# Patient Record
Sex: Male | Born: 1980 | Race: White | Hispanic: No | Marital: Married
Health system: Midwestern US, Community
[De-identification: ages and names within clinical notes are randomized; demographics above are authoritative.]

## PROBLEM LIST (undated history)

## (undated) DIAGNOSIS — G43909 Migraine, unspecified, not intractable, without status migrainosus: Secondary | ICD-10-CM

## (undated) DIAGNOSIS — K589 Irritable bowel syndrome without diarrhea: Secondary | ICD-10-CM

## (undated) DIAGNOSIS — E785 Hyperlipidemia, unspecified: Secondary | ICD-10-CM

## (undated) DIAGNOSIS — F419 Anxiety disorder, unspecified: Secondary | ICD-10-CM

## (undated) DIAGNOSIS — S02609A Fracture of mandible, unspecified, initial encounter for closed fracture: Secondary | ICD-10-CM

## (undated) DIAGNOSIS — E039 Hypothyroidism, unspecified: Secondary | ICD-10-CM

## (undated) DIAGNOSIS — R569 Unspecified convulsions: Secondary | ICD-10-CM

## (undated) HISTORY — DX: Anxiety disorder, unspecified: F41.9

## (undated) HISTORY — DX: Hypothyroidism, unspecified: E03.9

## (undated) HISTORY — DX: Irritable bowel syndrome, unspecified: K58.9

## (undated) HISTORY — PX: HIP SURGERY: SHX245

## (undated) HISTORY — PX: ESOPHAGOGASTRODUODENOSCOPY: SHX1529

## (undated) HISTORY — PX: COLONOSCOPY W/ BIOPSIES: SHX1374

## (undated) HISTORY — PX: MANDIBLE SURGERY: SHX707

---
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
## 2007-12-04 ENCOUNTER — Emergency Department (HOSPITAL_COMMUNITY): Admission: EM | Admit: 2007-12-04 | Discharge: 2007-12-04 | Payer: Self-pay | Admitting: Emergency Medicine

## 2008-12-19 ENCOUNTER — Emergency Department (HOSPITAL_COMMUNITY): Admission: EM | Admit: 2008-12-19 | Discharge: 2008-12-19 | Payer: Self-pay | Admitting: Emergency Medicine

## 2009-03-23 ENCOUNTER — Encounter: Admission: RE | Admit: 2009-03-23 | Discharge: 2009-03-23 | Payer: Self-pay | Admitting: Family Medicine

## 2009-10-26 ENCOUNTER — Encounter: Payer: Self-pay | Admitting: Gastroenterology

## 2009-10-26 ENCOUNTER — Encounter: Admission: RE | Admit: 2009-10-26 | Discharge: 2009-10-26 | Payer: Self-pay | Admitting: Family Medicine

## 2009-10-27 ENCOUNTER — Encounter: Payer: Self-pay | Admitting: Gastroenterology

## 2009-10-28 ENCOUNTER — Encounter: Payer: Self-pay | Admitting: Gastroenterology

## 2009-10-29 DIAGNOSIS — R3129 Other microscopic hematuria: Secondary | ICD-10-CM | POA: Insufficient documentation

## 2009-10-29 DIAGNOSIS — E7800 Pure hypercholesterolemia, unspecified: Secondary | ICD-10-CM | POA: Insufficient documentation

## 2009-10-29 DIAGNOSIS — F411 Generalized anxiety disorder: Secondary | ICD-10-CM | POA: Insufficient documentation

## 2009-10-29 DIAGNOSIS — E039 Hypothyroidism, unspecified: Secondary | ICD-10-CM | POA: Insufficient documentation

## 2009-10-30 ENCOUNTER — Ambulatory Visit: Payer: Self-pay | Admitting: Gastroenterology

## 2009-10-30 DIAGNOSIS — R197 Diarrhea, unspecified: Secondary | ICD-10-CM | POA: Insufficient documentation

## 2009-11-03 ENCOUNTER — Telehealth: Payer: Self-pay | Admitting: Gastroenterology

## 2009-11-04 ENCOUNTER — Encounter (INDEPENDENT_AMBULATORY_CARE_PROVIDER_SITE_OTHER): Payer: Self-pay | Admitting: *Deleted

## 2009-11-06 ENCOUNTER — Ambulatory Visit: Payer: Self-pay | Admitting: Gastroenterology

## 2009-11-06 DIAGNOSIS — K2900 Acute gastritis without bleeding: Secondary | ICD-10-CM | POA: Insufficient documentation

## 2009-11-06 LAB — CONVERTED CEMR LAB: UREASE: NEGATIVE

## 2009-11-10 ENCOUNTER — Encounter: Payer: Self-pay | Admitting: Gastroenterology

## 2009-12-28 ENCOUNTER — Ambulatory Visit: Payer: Self-pay | Admitting: Endocrinology

## 2010-03-14 ENCOUNTER — Emergency Department (HOSPITAL_COMMUNITY): Admission: EM | Admit: 2010-03-14 | Discharge: 2010-03-15 | Payer: Self-pay | Admitting: Emergency Medicine

## 2010-05-23 ENCOUNTER — Encounter: Payer: Self-pay | Admitting: Orthopaedic Surgery

## 2010-06-01 NOTE — Progress Notes (Signed)
Summary: update condt  Phone Note Call from Patient Call back at Home Phone [phone]   Caller: Patient Call For: Jarold Motto Reason for Call: Talk to Nurse Summary of Call: Patient wants to let Dr Jarold Motto know that his body feels better but that his stomach is having a really bad side effect from the antibiotics. Initial call taken by: Tawni Levy,  November 03, 2009 2:23 PM  Follow-up for Phone Call        LM for to to call.   Lupita Leash Surface RN  November 03, 2009 2:57 PM  Pt states medication has not helped.  Diarrhea is worse.  Pain on rt side has improved but now entire abd hurts.  Pt is getting married in 2 weeks.   Follow-up by: Ashok Cordia RN,  November 04, 2009 8:57 AM  Additional Follow-up for Phone Call Additional follow up Details #1::        endo/colon ...add on if needed Additional Follow-up by: Mardella Layman MD [facility],  November 04, 2009 9:33 AM    Additional Follow-up for Phone Call Additional follow up Details #2::    Proc sch for Friday 11/06/09 at 1:00.  Talked with Quincy Carnes RN. Pt notified.  Will stop by today for insturctions. Follow-up by: Ashok Cordia RN,  November 04, 2009 10:54 AM  New/Updated Medications: MOVIPREP 100 GM  SOLR (PEG-KCL-NACL-NASULF-NA ASC-C) As per prep instructions. Prescriptions: MOVIPREP 100 GM  SOLR (PEG-KCL-NACL-NASULF-NA ASC-C) As per prep instructions.  #1 x 0   Entered by:   Ashok Cordia RN   Authorized by:   Mardella Layman MD [facility]   Signed by:   Ashok Cordia RN on 11/04/2009   Method used:   [facility]  [address] [phone]       [address]       [place], [place]  [address]       Ph: [phone]       Fax: [phone]   RxID:   [id number]

## 2010-06-01 NOTE — Miscellaneous (Signed)
Summary: Orders Update/clotest  Clinical Lists Changes  Problems: Added new problem of ACUTE GASTRITIS WITHOUT MENTION OF HEMORRHAGE (ICD-535.00) Orders: Added new Test order of TLB-H Pylori Screen Gastric Biopsy (83013-CLOTEST) - Signed 

## 2010-06-01 NOTE — Letter (Signed)
Summary: Patient Notice-Endo Biopsy Results  [facility]  [address] [place], [place] [address]   Phone: [phone]  Fax: [phone]        November 10, 2009 MRN: [id number]    [facility] [address] [place], [place]  [address]    Dear Mr. CASELLI,  I am pleased to inform you that the biopsies taken during your recent endoscopic examination did not show any evidence of cancer upon pathologic examination.Duodenal-small bowel biopsy was normal without evidence of celiac disease.  Additional information/recommendations:  __No further action is needed at this time.  Please follow-up with      your primary care physician for your other healthcare needs.  __ Please call [phone] to schedule a return visit to review      your condition.  _x_ Continue with the treatment plan as outlined on the day of your      exam.  __ You should have a repeat endoscopic examination for this problem              in _ months/years.   Please call us if you are having persistent problems or have questions about your condition that have not been fully answered at this time.  Sincerely,  Mardella Layman MD [facility]  This letter has been electronically signed by your physician.  Appended Document: Patient Notice-Endo Biopsy Results letter mailed .

## 2010-06-01 NOTE — Letter (Signed)
Summary: [facility]  [facility]   Imported By: Lester [place] 11/05/2009 11:23:35  _____________________________________________________________________  External Attachment:    Type:   Image     Comment:   External Document

## 2010-06-01 NOTE — Assessment & Plan Note (Signed)
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 He used Imodium with good success. He denies infectious disease exposure or foreign travel. He was initially seen at urgent care and had negative CBC, metabolic profile, abdominal ultrasound, stool culture, O&P, lactoferrin level, and was empirically placed on Cipro with no improvement. 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 He had no nausea vomiting, dyspepsia, R. complaints, melena, or rash. He does have chronic arthralgias for many years and has had traumatic injury previously to his right hip. He's cut back the fiber in his diet over the last month and has lost 10 pounds in weight. He denies use or abuse of NSAIDs.Stool exam previously was negative for C. difficile.   GI Review of Systems    Reports abdominal pain, bloating, and  loss of appetite.     Location of  Abdominal pain: RLQ.    Denies acid reflux, belching, chest pain, dysphagia with liquids, dysphagia with solids, heartburn, nausea, vomiting, vomiting blood, weight loss, and  weight gain.      Reports diarrhea.     Denies anal fissure, black tarry stools, change in bowel habit, constipation, diverticulosis, fecal incontinence, heme positive stool, hemorrhoids, irritable bowel syndrome, jaundice, light color stool, liver problems, rectal bleeding, and  rectal pain.    Current Medications (verified): 1)  Ambien Cr 12.5 Mg  Cr-Tabs (Zolpidem Tartrate) 2)  Levothyroxine Sodium 50 Mcg Tabs (Levothyroxine Sodium) .... Take 1 Tablet By Mouth Once A Day  Allergies (verified): No Known Drug Allergies  Past History:  Past medical, surgical, family and social histories (including risk factors) reviewed for relevance to current acute and chronic problems.  Past Medical History: Reviewed history from 10/29/2009 and no changes required. Anxiety Disorder Hyperlipidemia Hypothyroidism Chronic Benign Microhematuria  Past Surgical History: Reviewed history from 10/29/2009 and no changes required. Eye Surgery Dental Surgery  Family History: Reviewed history from 10/29/2009 and no changes required. Family History of Diabetes: Mother Family History of Heart Disease: Mother  No FH of Colon Cancer:  Social History: Reviewed history from 10/29/2009 and no changes required. 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  Alcohol Use - no Illicit Drug Use - no Smoking Status:  never Drug Use:  no  Review of Systems       The patient complains of anxiety-new, blood in urine, fatigue, headaches-new, sleeping problems, and thirst - excessive.  The patient denies allergy/sinus, anemia, arthritis/joint pain, back pain, breast changes/lumps, change in vision, confusion, cough, coughing up blood, depression-new, fainting, fever, hearing problems, heart murmur, heart rhythm changes, itching, menstrual pain, muscle pains/cramps, night sweats, nosebleeds, pregnancy symptoms, shortness of breath, skin rash, sore throat, swelling of feet/legs, swollen lymph glands, thirst - excessive , urination - excessive , urination changes/pain, urine leakage, vision changes, and voice change.         ultrasound exam showed a small 1 cm right renal cyst.  Vital Signs:  Patient profile:   30 year  old male Height:      68 inches Weight:      172 pounds BMI:     26.25 BSA:     1.92 Pulse rate:   96 /  minute Pulse rhythm:   regular BP sitting:   122 / 74  (left arm) Cuff size:   regular  Vitals Entered By: Ok Anis CMA (October 30, 2009 9:15 AM)  Physical Exam  General:  Well developed, well nourished, no acute distress.healthy appearing.   Head:  Normocephalic and atraumatic. Eyes:  PERRLA, no icterus.exam deferred to patient's ophthalmologist.   Neck:  Supple; no masses or thyromegaly. Lungs:  Clear throughout to auscultation. Heart:  Regular rate and rhythm; no murmurs, rubs,  or bruits. Abdomen:  Soft, nontender and nondistended. No masses, hepatosplenomegaly or hernias noted. Normal bowel sounds. Rectal:  Normal exam.hemocult negative.  Stool is soft, not watery, and guaiac-negative. Prostate:  .normal size prostate.   Msk:  Symmetrical with no gross deformities. Normal posture. Pulses:  Normal pulses noted. Extremities:  No clubbing, cyanosis, edema or deformities noted. Neurologic:  Alert and  oriented x4;  grossly normal neurologically. Cervical Nodes:  No significant cervical adenopathy. Psych:  Alert and cooperative. Normal mood and affect.   Impression & Recommendations:  Problem # 1:  DIARRHEA (ICD-787.91) Assessment Unchanged Symptoms and presentation suggestive of giardiasis. No reason to suspect inflammatory bowel disease or chronic malabsorption. I have placed him on metronidazole 250 mg t.i.d. for 10 days along with probiotic therapy. He is to call per his symptomatic response. If he does not improve we will proceed with colonoscopy, endoscopy, small bowel biopsy. He is to be on low fiber diet as tolerated. Strict avoidance of NSAIDs advised. The patient has no history of lactose intolerance, and denies use of sorbitol/ fructose.  Problem # 2:  ANXIETY (ICD-300.00) Assessment: Unchanged Pending marriage in 2 weeks' time. He may need further workup before he leaves the country on his honeymoon depending on his clinical course.  Patient Instructions: 1)  Pick up  your prescription from your pharmacy.  2)  Start Align one tablet by mouth once daily. 3)  Call us back in 2 weeks if symptoms do not improve. [phone] 4)  Copy sent to : Knox Royalty, MD --[facility] 5)                          Clent Ridges, MD 6)  The medication list was reviewed and reconciled.  All changed / newly prescribed medications were explained.  A complete medication list was provided to the patient / caregiver. Prescriptions: METRONIDAZOLE 250 MG TABS (METRONIDAZOLE) one tablet by mouth three times a day x 10 days  #30 x 0   Entered by:   Christie Nottingham CMA (AAMA)   Authorized by:   Mardella Layman MD [facility]   Signed by:   Mardella Layman MD [facility] on 10/30/2009   Method used:   [facility]  [address] [phone][facility] ([facility]ail)       [address]       [place], [place]  [address]       Ph: [phone]       Fax: [phone]   RxID:   [id number]

## 2010-06-01 NOTE — Letter (Signed)
Summary: [facility] Instructions  [facility]  [address] [place], [place] [address]   Phone: [phone]  Fax: [phone]       Gregory Bush    Oct 11, 1980    MRN: [id number]        Procedure Day /Date: Friday 11/06/09     Arrival Time: 12:00      Procedure Time: 1:00     Location of Procedure:                    _X _  [facility] (4th Floor)                        PREPARATION FOR COLONOSCOPY WITH MOVIPREP   Starting today do not eat nuts, seeds, popcorn, corn, beans, peas,  salads, or any raw vegetables.  Do not take any fiber supplements (e.g. Metamucil, Citrucel, and Benefiber).  THE DAY BEFORE YOUR PROCEDURE         DATE: 11/05/09    DAY: Thursday  1.  Drink clear liquids the entire day-NO SOLID FOOD  2.  Do not drink anything colored red or purple.  Avoid juices with pulp.  No orange juice.  3.  Drink at least 64 oz. (8 glasses) of fluid/clear liquids during the day to prevent dehydration and help the prep work efficiently.  CLEAR LIQUIDS INCLUDE: Water Jello Ice Popsicles Tea (sugar ok, no milk/cream) Powdered fruit flavored drinks Coffee (sugar ok, no milk/cream) Gatorade Juice: apple, white grape, white cranberry  Lemonade Clear bullion, consomm, broth Carbonated beverages (any kind) Strained chicken noodle soup Hard Candy                             4.  In the morning, mix first dose of MoviPrep solution:    Empty 1 Pouch A and 1 Pouch B into the disposable container    Add lukewarm drinking water to the top line of the container. Mix to dissolve    Refrigerate (mixed solution should be used within 24 hrs)  5.  Begin drinking the prep at 5:00 p.m. The MoviPrep container is divided by 4 marks.   Every 15 minutes drink the solution down to the next mark (approximately 8 oz) until the full liter is complete.   6.  Follow completed prep with 16 oz of clear liquid of your choice (Nothing red or purple).  Continue to drink  clear liquids until bedtime.  7.  Before going to bed, mix second dose of MoviPrep solution:    Empty 1 Pouch A and 1 Pouch B into the disposable container    Add lukewarm drinking water to the top line of the container. Mix to dissolve    Refrigerate  THE DAY OF YOUR PROCEDURE      DATE: 11/06/09  DAY: Friday  Beginning at 8:00 a.m. (5 hours before procedure):         1. Every 15 minutes, drink the solution down to the next mark (approx 8 oz) until the full liter is complete.  2. Follow completed prep with 16 oz. of clear liquid of your choice.    3. You may drink clear liquids until 11:00  (2 HOURS BEFORE PROCEDURE).   MEDICATION INSTRUCTIONS  Unless otherwise instructed, you should take regular prescription medications with a small sip of water   as early as possible the morning of your procedure.  OTHER INSTRUCTIONS  You will need a responsible adult at least 32 years of age to accompany you and drive you home.   This person must remain in the waiting room during your procedure.  Wear loose fitting clothing that is easily removed.  Leave jewelry and other valuables at home.  However, you may wish to bring a book to read or  an iPod/MP3 player to listen to music as you wait for your procedure to start.  Remove all body piercing jewelry and leave at home.  Total time from sign-in until discharge is approximately 2-3 hours.  You should go home directly after your procedure and rest.  You can resume normal activities the  day after your procedure.  The day of your procedure you should not:   Drive   Make legal decisions   Operate machinery   Drink alcohol   Return to work  You will receive specific instructions about eating, activities and medications before you leave.    The above instructions have been reviewed and explained to me by   _______________________    I fully understand and can verbalize these instructions  _____________________________ Date _________

## 2010-06-01 NOTE — Miscellaneous (Signed)
Summary: Librax  Clinical Lists Changes  Medications: Added new medication of LIBRAX 2.5-5 MG  CAPS (CLIDINIUM-CHLORDIAZEPOXIDE) 1  every 6-8 hrs per abd discomfort - Signed Rx of LIBRAX 2.5-5 MG  CAPS (CLIDINIUM-CHLORDIAZEPOXIDE) 1  every 6-8 hrs per abd discomfort;  #65 x 2;  Signed;  Entered by: Clide Cliff RN;  Authorized by: Mardella Layman MD [facility];  Method used[facility] [address] [id number] [address] [place], [place], [place]  [address], Ph: [phone], Fax: [phone] Observations: Added new observation of ALLERGY REV: Done (11/06/2009 14:06)    Prescriptions: LIBRAX 2.5-5 MG  CAPS (CLIDINIUM-CHLORDIAZEPOXIDE) 1  every 6-8 hrs per abd discomfort  #65 x 2   Entered by:   Clide Cliff RN   Authorized by:   Mardella Layman MD [facility]   Signed by:   Clide Cliff RN on 11/06/2009   Method used:   [facility]        [facility]  [address] [phone][facility] (retail)       [address]       [place], [place]  [address]       Ph: [phone]       Fax: [phone]   RxID:   [id number]

## 2010-06-01 NOTE — Procedures (Signed)
Summary: Colonoscopy  Patient: Gregory Bush Note: All result statuses are Final unless otherwise noted.  Tests: (1) Colonoscopy (COL)   COL Colonoscopy           DONE     [facility]     [address]     [place], [place]  [address]           COLONOSCOPY PROCEDURE REPORT           PATIENT:  [name], [name]  MR#:  [id number]     BIRTHDATE:  23-Dec-1980, 28 yrs. old  GENDER:  male     ENDOSCOPIST:  Vania Rea. Jarold Motto, MD, [facility]     REF. BY:     PROCEDURE DATE:  11/06/2009     PROCEDURE:  Diagnostic Colonoscopy     ASA CLASS:  Class I     INDICATIONS:  unexplained diarrhea     MEDICATIONS:   Fentanyl 75 mcg IV, Versed 9 mg IV, Benadryl 25 mg     IV           DESCRIPTION OF PROCEDURE:   After the risks benefits and     alternatives of the procedure were thoroughly explained, informed     consent was obtained.  Digital rectal exam was performed and     revealed no abnormalities.   The LB CF-[id number] endoscope     was introduced through the anus and advanced to the terminal ileum     which was intubated for a short distance, without limitations.     The quality of the prep was excellent, using MoviPrep.  The     instrument was then slowly withdrawn as the colon was fully     examined.     <<PROCEDUREIMAGES>>           FINDINGS:  No polyps or cancers were seen.  This was otherwise a     normal examination of the colon.   Retroflexed views in the rectum     revealed no abnormalities.    The scope was then withdrawn from     the patient and the procedure completed.           COMPLICATIONS:  None     ENDOSCOPIC IMPRESSION:     1) No polyps or cancers     2) Otherwise normal examination     RECOMMENDATIONS:     1) Upper Endoscopy     REPEAT EXAM:  No           ______________________________     Vania Rea. Jarold Motto, MD, [facility]           CC:  Larey Dresser, MD           n.     Rosalie Doctor:   Vania Rea. Ermine Spofford at 11/06/2009 01:33 PM           Lonni Fix,  [id number]  Note: An exclamation mark (!) indicates a result that was not dispersed into the flowsheet. Document Creation Date: 11/06/2009 1:34 PM _______________________________________________________________________  (1) Order result status: Final Collection or observation date-time: 11/06/2009 13:26 Requested date-time:  Receipt date-time:  Reported date-time:  Referring Physician:   Ordering Physician: Sheryn Bison [phone]) Specimen Source:  Source: Launa Grill Order Number: [id number] Lab site:

## 2010-06-01 NOTE — Procedures (Signed)
Summary: Upper Endoscopy  Patient: Gregory Bush Note: All result statuses are Final unless otherwise noted.  Tests: (1) Upper Endoscopy (EGD)   EGD Upper Endoscopy       DONE     [facility]     [address]     [place], [place]  [address]           ENDOSCOPY PROCEDURE REPORT           PATIENT:  Gregory Bush, Gregory Bush  MR#:  [id number]     BIRTHDATE:  February 20, 1981, 28 yrs. old  GENDER:  male           ENDOSCOPIST:  Vania Rea. Jarold Motto, MD, [facility]     Referred by:  Larey Dresser, M.D.           PROCEDURE DATE:  11/06/2009     PROCEDURE:  EGD with biopsy     ASA CLASS:  Class I     INDICATIONS:  diarrhea           MEDICATIONS:   There was residual sedation effect present from     prior procedure., Fentanyl 25 mcg IV, Versed 1 mg IV     TOPICAL ANESTHETIC:           DESCRIPTION OF PROCEDURE:   After the risks benefits and     alternatives of the procedure were thoroughly explained, informed     consent was obtained.  The LB GIF-H180 [id number] endoscope was     introduced through the mouth and advanced to the second portion of     the duodenum, limited by retching and gagging.   The instrument     was slowly withdrawn as the mucosa was fully examined.     <<PROCEDUREIMAGES>>           Esophagitis was found in the distal esophagus. free reflux noted.     Normal duodenal folds were noted. SMALL BOWEL BX. DONE.  The     stomach was entered and closely examined. The antrum, angularis,     and lesser curvature were well visualized, including a retroflexed     view of the cardia and fundus. The stomach wall was normally     distensable. The scope passed easily through the pylorus into the     duodenum.    Retroflexed views revealed a hiatal hernia.  2-3 CM     HH NOTED.  The scope was then withdrawn from the patient and the     procedure completed.           COMPLICATIONS:  None           ENDOSCOPIC IMPRESSION:     1) Esophagitis in the distal esophagus     2) Normal duodenal  folds     3) Normal stomach     4) A hiatal hernia     GERD     RECOMMENDATIONS:     DEXILANT 60 MG/DAY           REPEAT EXAM:  No           ______________________________     Vania Rea. Jarold Motto, MD, [facility]           CC:  Larey Dresser, MD           n.     Rosalie Doctor:   Vania Rea. Darrel Baroni at 11/06/2009 01:51 PM           Lonni Fix, [id number]  Note: An exclamation mark Marland Kitchen)  indicates a result that was not dispersed into the flowsheet. Document Creation Date: 11/06/2009 1:52 PM _______________________________________________________________________  (1) Order result status: Final Collection or observation date-time: 11/06/2009 13:38 Requested date-time:  Receipt date-time:  Reported date-time:  Referring Physician:   Ordering Physician: Sheryn Bison [phone]) Specimen Source:  Source: Launa Grill Order Number: [id number] Lab site:

## 2010-07-13 LAB — BASIC METABOLIC PANEL
BUN: 19 mg/dL (ref 6–23)
CO2: 31 mEq/L (ref 19–32)
Calcium: 9.8 mg/dL (ref 8.4–10.5)
Creatinine, Ser: 1.04 mg/dL (ref 0.4–1.5)
Glucose, Bld: 108 mg/dL — ABNORMAL HIGH (ref 70–99)
Sodium: 143 mEq/L (ref 135–145)

## 2010-07-13 LAB — CBC
Hemoglobin: 14.4 g/dL (ref 13.0–17.0)
MCH: 29.1 pg (ref 26.0–34.0)
MCHC: 34 g/dL (ref 30.0–36.0)

## 2010-07-13 LAB — DIFFERENTIAL
Basophils Relative: 0 % (ref 0–1)
Eosinophils Absolute: 0.3 10*3/uL (ref 0.0–0.7)
Monocytes Absolute: 0.8 10*3/uL (ref 0.1–1.0)
Monocytes Relative: 8 % (ref 3–12)
Neutrophils Relative %: 53 % (ref 43–77)

## 2010-07-13 LAB — POCT CARDIAC MARKERS: Myoglobin, poc: 46.3 ng/mL (ref 12–200)

## 2010-07-22 ENCOUNTER — Other Ambulatory Visit: Payer: Self-pay | Admitting: Occupational Medicine

## 2010-07-22 ENCOUNTER — Ambulatory Visit
Admission: RE | Admit: 2010-07-22 | Discharge: 2010-07-22 | Disposition: A | Discharge status: Home / Self Care | Payer: Self-pay | Source: Ambulatory Visit | Attending: Occupational Medicine | Admitting: Occupational Medicine

## 2010-07-22 DIAGNOSIS — R52 Pain, unspecified: Secondary | ICD-10-CM

## 2010-09-08 ENCOUNTER — Emergency Department (HOSPITAL_COMMUNITY)
Admission: EM | Admit: 2010-09-08 | Discharge: 2010-09-09 | Disposition: A | Discharge status: Home / Self Care | Payer: 59 | Attending: Emergency Medicine | Admitting: Emergency Medicine

## 2010-09-08 DIAGNOSIS — E860 Dehydration: Secondary | ICD-10-CM | POA: Insufficient documentation

## 2010-09-08 DIAGNOSIS — E039 Hypothyroidism, unspecified: Secondary | ICD-10-CM | POA: Insufficient documentation

## 2010-09-08 DIAGNOSIS — R51 Headache: Secondary | ICD-10-CM | POA: Insufficient documentation

## 2010-09-08 DIAGNOSIS — M545 Low back pain, unspecified: Secondary | ICD-10-CM | POA: Insufficient documentation

## 2010-09-08 DIAGNOSIS — R112 Nausea with vomiting, unspecified: Secondary | ICD-10-CM | POA: Insufficient documentation

## 2010-09-08 DIAGNOSIS — R55 Syncope and collapse: Secondary | ICD-10-CM | POA: Insufficient documentation

## 2011-03-26 ENCOUNTER — Encounter: Payer: Self-pay | Admitting: *Deleted

## 2011-03-26 ENCOUNTER — Emergency Department (HOSPITAL_COMMUNITY)
Admission: EM | Admit: 2011-03-26 | Discharge: 2011-03-26 | Disposition: A | Discharge status: Home / Self Care | Payer: 59 | Attending: Emergency Medicine | Admitting: Emergency Medicine

## 2011-03-26 DIAGNOSIS — Z79899 Other long term (current) drug therapy: Secondary | ICD-10-CM | POA: Insufficient documentation

## 2011-03-26 DIAGNOSIS — E039 Hypothyroidism, unspecified: Secondary | ICD-10-CM | POA: Insufficient documentation

## 2011-03-26 DIAGNOSIS — R51 Headache: Secondary | ICD-10-CM | POA: Insufficient documentation

## 2011-03-26 DIAGNOSIS — R112 Nausea with vomiting, unspecified: Secondary | ICD-10-CM | POA: Insufficient documentation

## 2011-03-26 LAB — DIFFERENTIAL
Eosinophils Relative: 0 % (ref 0–5)
Lymphocytes Relative: 4 % — ABNORMAL LOW (ref 12–46)
Lymphs Abs: 0.4 10*3/uL — ABNORMAL LOW (ref 0.7–4.0)
Monocytes Absolute: 0.5 10*3/uL (ref 0.1–1.0)
Monocytes Relative: 5 % (ref 3–12)

## 2011-03-26 LAB — COMPREHENSIVE METABOLIC PANEL
CO2: 26 mEq/L (ref 19–32)
Calcium: 10.3 mg/dL (ref 8.4–10.5)
Creatinine, Ser: 0.9 mg/dL (ref 0.50–1.35)
GFR calc Af Amer: >90 mL/min (ref >90)
GFR calc non Af Amer: >90 mL/min (ref >90)
Glucose, Bld: 120 mg/dL — ABNORMAL HIGH (ref 70–99)

## 2011-03-26 LAB — CBC
HCT: 43.9 % (ref 39.0–52.0)
MCV: 84.3 fL (ref 78.0–100.0)
RBC: 5.21 MIL/uL (ref 4.22–5.81)
WBC: 11.8 10*3/uL — ABNORMAL HIGH (ref 4.0–10.5)

## 2011-03-26 MED ORDER — METOCLOPRAMIDE HCL 5 MG/ML IJ SOLN
10.0000 mg | Freq: Once | INTRAMUSCULAR | Status: AC
  Administered 2011-03-26: 10 mg via INTRAVENOUS
  Filled 2011-03-26: qty 2

## 2011-03-26 MED ORDER — ONDANSETRON 8 MG PO TBDP: 8.0000 mg | ORAL_TABLET | Freq: Three times a day (TID) | ORAL | Status: AC | PRN

## 2011-03-26 MED ORDER — DIPHENHYDRAMINE HCL 50 MG/ML IJ SOLN
12.5000 mg | Freq: Once | INTRAMUSCULAR | Status: AC
  Administered 2011-03-26: 12.5 mg via INTRAVENOUS
  Filled 2011-03-26: qty 1

## 2011-03-26 MED ORDER — SODIUM CHLORIDE 0.9 % IV BOLUS (SEPSIS)
2000.0000 mL | Freq: Once | INTRAVENOUS | Status: AC
  Administered 2011-03-26: 2000 mL via INTRAVENOUS

## 2011-03-26 MED ORDER — ONDANSETRON HCL 4 MG/2ML IJ SOLN: 4.0000 mg | Freq: Once | INTRAMUSCULAR | Status: DC

## 2011-03-26 NOTE — ED Provider Notes (Signed)
History     CSN: [id number] Arrival date & time: 03/26/2011  8:01 PM   First MD Initiated Contact with Patient 03/26/11 2012      Chief Complaint  Patient presents with  . Emesis    (Consider location/radiation/quality/duration/timing/severity/associated sxs/prior treatment) Patient is a 30 y.o. male presenting with vomiting. The history is provided by the patient.  Emesis  This is a new problem. The current episode started 12 to 24 hours ago. The problem occurs more than 10 times per day. The problem has been gradually worsening. The emesis has an appearance of stomach contents and bilious material. There has been no fever. Associated symptoms include headaches. Pertinent negatives include no abdominal pain, no chills, no diarrhea, no fever and no sweats. Risk factors include ill contacts (Family members have had the same thing.).    Past Medical History  Diagnosis Date  . Hypothyroid     History reviewed. No pertinent past surgical history.  History reviewed. No pertinent family history.  History  Substance Use Topics  . Smoking status: Never Smoker   . Smokeless tobacco: Not on file  . Alcohol Use: No      Review of Systems  Constitutional: Negative for fever and chills.  Gastrointestinal: Positive for vomiting. Negative for abdominal pain and diarrhea.  Neurological: Positive for headaches.  All other systems reviewed and are negative.    Allergies  Review of patient's allergies indicates no known allergies.  Home Medications   Current Outpatient Rx  Name Route Sig Dispense Refill  . LEVOTHYROXINE SODIUM 50 MCG PO TABS Oral Take 50 mcg by mouth daily.      . OXYCODONE-ACETAMINOPHEN 10-325 MG PO TABS Oral Take 1 tablet by mouth every 4 (four) hours as needed. For pain       BP 118/79  Pulse 117  Temp(Src) 98.3 F (36.8 C) (Oral)  Resp 16  SpO2 100%  Physical Exam  Nursing note and vitals reviewed. Constitutional: He appears well-developed and  well-nourished. No distress.  HENT:  Head: Normocephalic and atraumatic.  Right Ear: External ear normal.  Left Ear: External ear normal.       Dry mucous membranes  Eyes: Conjunctivae are normal. Right eye exhibits no discharge. Left eye exhibits no discharge. No scleral icterus.  Neck: Neck supple. No tracheal deviation present.  Cardiovascular: Normal rate, regular rhythm and intact distal pulses.   Pulmonary/Chest: Effort normal and breath sounds normal. No stridor. No respiratory distress. He has no wheezes. He has no rales.  Abdominal: Soft. Bowel sounds are normal. He exhibits no distension. There is no tenderness. There is no rebound and no guarding.  Musculoskeletal: He exhibits no edema and no tenderness.  Neurological: He is alert. He has normal strength. No sensory deficit. Cranial nerve deficit:  no gross defecits noted. He exhibits normal muscle tone. He displays no seizure activity. Coordination normal.  Skin: Skin is warm and dry. No rash noted.  Psychiatric: He has a normal mood and affect.    ED Course  Procedures (including critical care time)  Labs Reviewed  CBC - Abnormal; Notable for the following:    WBC 11.8 (*)    All other components within normal limits  DIFFERENTIAL - Abnormal; Notable for the following:    Neutrophils Relative 92 (*)    Neutro Abs 10.8 (*)    Lymphocytes Relative 4 (*)    Lymphs Abs 0.4 (*)    All other components within normal limits  COMPREHENSIVE METABOLIC PANEL - Abnormal;  Notable for the following:    Glucose, Bld 120 (*)    AST 90 (*)    ALT 124 (*)    All other components within normal limits   No results found.    MDM  Patient most likely has a viral type of illness. He had family members that were ill as well recently. Patient has no focal abdominal tenderness. There is no signs of severe dehydration. Patient has been given IV fluids. He'll be discharged home on a course of oral antibiotics.        Celene Kras,  MD 03/26/11 2158

## 2011-03-26 NOTE — ED Notes (Signed)
Pt states that he has been vomiting since 03:00 this morning. Pt states that his in-laws have been sick and he feels like he caught what they had. Pt states that he has not been able to keep food or drink down. Pt also complaining of a HA pt has history of migraines.

## 2011-03-26 NOTE — ED Notes (Signed)
Patient states that his nasuea and vomiting started today around 3am in the morning.  He denies diarrhea

## 2011-09-30 ENCOUNTER — Other Ambulatory Visit: Payer: Self-pay | Admitting: Physician Assistant

## 2011-09-30 DIAGNOSIS — M25551 Pain in right hip: Secondary | ICD-10-CM

## 2011-10-03 ENCOUNTER — Emergency Department (HOSPITAL_COMMUNITY)
Admission: EM | Admit: 2011-10-03 | Discharge: 2011-10-03 | Disposition: A | Discharge status: Home / Self Care | Payer: 59 | Attending: Emergency Medicine | Admitting: Emergency Medicine

## 2011-10-03 ENCOUNTER — Encounter (HOSPITAL_COMMUNITY): Payer: Self-pay | Admitting: Emergency Medicine

## 2011-10-03 DIAGNOSIS — E039 Hypothyroidism, unspecified: Secondary | ICD-10-CM | POA: Insufficient documentation

## 2011-10-03 DIAGNOSIS — K5900 Constipation, unspecified: Secondary | ICD-10-CM | POA: Insufficient documentation

## 2011-10-03 MED ORDER — DOCUSATE SODIUM 100 MG PO CAPS: 100.0000 mg | ORAL_CAPSULE | Freq: Two times a day (BID) | ORAL | Status: AC

## 2011-10-03 MED ORDER — FLEET ENEMA 7-19 GM/118ML RE ENEM
1.0000 | ENEMA | Freq: Once | RECTAL | Status: AC
  Administered 2011-10-03: 1 via RECTAL
  Filled 2011-10-03: qty 1

## 2011-10-03 MED ORDER — POLYETHYLENE GLYCOL 3350 17 GM/SCOOP PO POWD: 17.0000 g | Freq: Every day | ORAL | Status: AC

## 2011-10-03 MED ORDER — MAGNESIUM CITRATE PO SOLN
1.0000 | Freq: Once | ORAL | Status: AC
  Administered 2011-10-03: 1 via ORAL
  Filled 2011-10-03: qty 296

## 2011-10-03 MED ORDER — ONDANSETRON 4 MG PO TBDP
4.0000 mg | ORAL_TABLET | Freq: Once | ORAL | Status: AC
  Administered 2011-10-03: 4 mg via ORAL
  Filled 2011-10-03: qty 1

## 2011-10-03 NOTE — ED Provider Notes (Signed)
History     CSN: [id number]  Arrival date & time 10/03/11  4098   First MD Initiated Contact with Patient 10/03/11 [phone]      Chief Complaint  Patient presents with  . Abdominal Pain    (Consider location/radiation/quality/duration/timing/severity/associated sxs/prior treatment) HPI Hx from pt. 31yo M with PMH hypothyroidism who presents with 5 day hx nausea, decreased appetite, constipation. Has not had BM in past 5 days. Seen by PCP on Fri who obtained US abd and plain films - he was told that he was constipated and instructed to take Dulcolax. He had increased pain this evening and took Dulcolax (for the first time) around 2000. Has not been able to have a bowel movement yet with this. Has had continued pain, described as discomfort and fullness, to generalized abd. Able to pass flatus. No hx abd surgeries.  Pt does take oxycodone 30 mg q4h prn pain - states he is scheduled for hip reconstructive surgery soon (secondary to old sports injury) and this is for pain until he is able to have his surgery. He has been taking more frequently recently 2/2 increased pain. States he tries to get extra fiber in his diet when taking this but does not take stool softener with it.  Past Medical History  Diagnosis Date  . Hypothyroid     History reviewed. No pertinent past surgical history.  History reviewed. No pertinent family history.  History  Substance Use Topics  . Smoking status: Never Smoker   . Smokeless tobacco: Not on file  . Alcohol Use: No      Review of Systems  Constitutional: Negative for fever, chills, activity change and appetite change.  Respiratory: Negative for shortness of breath.   Cardiovascular: Negative for chest pain.  Gastrointestinal: Positive for nausea, abdominal pain, constipation and abdominal distention. Negative for vomiting, anal bleeding and rectal pain.    Allergies  Review of patient's allergies indicates no known allergies.  Home Medications    Current Outpatient Rx  Name Route Sig Dispense Refill  . LEVOTHYROXINE SODIUM 50 MCG PO TABS Oral Take 50 mcg by mouth daily.      Marland Kitchen OVER THE COUNTER MEDICATION Oral Take 1 tablet by mouth once. OTC laxative    . OXYCODONE HCL 30 MG PO TABS Oral Take 30 mg by mouth every 4 (four) hours as needed. For pain       BP 137/83  Pulse 83  Temp(Src) 98.7 F (37.1 C) (Oral)  Resp 18  SpO2 98%  Physical Exam  Nursing note and vitals reviewed. Constitutional: He appears well-developed and well-nourished. No distress.  HENT:  Head: Normocephalic and atraumatic.  Neck: Normal range of motion.  Cardiovascular: Normal rate, regular rhythm and normal heart sounds.   Pulmonary/Chest: Effort normal and breath sounds normal.  Abdominal: Soft. Bowel sounds are normal.       Abd with mild generalized tenderness most notable in upper abd. Palpable stool. No rebound or guard.  Musculoskeletal: Normal range of motion.  Neurological: He is alert.  Skin: Skin is warm and dry. He is not diaphoretic.  Psychiatric: He has a normal mood and affect.    ED Course  Procedures (including critical care time)  Labs Reviewed - No data to display No results found.   1. Constipation       MDM  Pt with constipation which is likely due to oxycodone. Fleet enema and mag citrate given in ED; pt was able to have BM with this and had  considerable sx relief. Will dc with rxes for Miralax and Colace; instructed to make sure he is taking Colace while taking oxycodone. Return precautions discussed. Pt agreeable with plan.       Grant Fontana, [place] 10/03/11 [phone]

## 2011-10-03 NOTE — ED Notes (Signed)
Patient with history of 5 days of nausea at night, loss of appetite.  Patient states he also had 5 days of no BM, took laxative per PCP, no results now having abdominal pain.

## 2011-10-03 NOTE — Discharge Instructions (Signed)
Your constipation may be due to the oxycodone. Please make sure to take a daily stool softener (Colace) while taking this. Also, take Miralax once to twice daily for the next 3 days as well to help with any residual constipation. Return to the ED if you have worsening or changing pain, start having vomiting, or with any other worrisome problems.

## 2011-10-04 NOTE — ED Provider Notes (Signed)
Medical screening examination/treatment/procedure(s) were performed by non-physician practitioner and as supervising physician I was immediately available for consultation/collaboration.  Sunnie Nielsen, MD 10/04/11 [phone]

## 2011-10-24 ENCOUNTER — Other Ambulatory Visit: Payer: 59

## 2011-12-12 ENCOUNTER — Ambulatory Visit
Admission: RE | Admit: 2011-12-12 | Discharge: 2011-12-12 | Disposition: A | Discharge status: Home / Self Care | Payer: 59 | Source: Ambulatory Visit | Attending: Physician Assistant | Admitting: Physician Assistant

## 2011-12-12 DIAGNOSIS — M25551 Pain in right hip: Secondary | ICD-10-CM

## 2011-12-12 MED ORDER — GADOBENATE DIMEGLUMINE 529 MG/ML IV SOLN
11.0000 mL | Freq: Once | INTRAVENOUS | Status: AC | PRN
  Administered 2011-12-12: 11 mL via INTRAVENOUS

## 2012-06-13 ENCOUNTER — Emergency Department (HOSPITAL_COMMUNITY): Payer: 59

## 2012-06-13 ENCOUNTER — Emergency Department (HOSPITAL_COMMUNITY)
Admission: EM | Admit: 2012-06-13 | Discharge: 2012-06-13 | Disposition: A | Discharge status: Home / Self Care | Payer: 59 | Attending: Emergency Medicine | Admitting: Emergency Medicine

## 2012-06-13 ENCOUNTER — Encounter (HOSPITAL_COMMUNITY): Payer: Self-pay | Admitting: *Deleted

## 2012-06-13 DIAGNOSIS — R059 Cough, unspecified: Secondary | ICD-10-CM | POA: Insufficient documentation

## 2012-06-13 DIAGNOSIS — G43909 Migraine, unspecified, not intractable, without status migrainosus: Secondary | ICD-10-CM | POA: Insufficient documentation

## 2012-06-13 DIAGNOSIS — R1110 Vomiting, unspecified: Secondary | ICD-10-CM | POA: Insufficient documentation

## 2012-06-13 DIAGNOSIS — R05 Cough: Secondary | ICD-10-CM | POA: Insufficient documentation

## 2012-06-13 DIAGNOSIS — R51 Headache: Secondary | ICD-10-CM | POA: Insufficient documentation

## 2012-06-13 DIAGNOSIS — E039 Hypothyroidism, unspecified: Secondary | ICD-10-CM | POA: Insufficient documentation

## 2012-06-13 DIAGNOSIS — R093 Abnormal sputum: Secondary | ICD-10-CM | POA: Insufficient documentation

## 2012-06-13 DIAGNOSIS — Z79899 Other long term (current) drug therapy: Secondary | ICD-10-CM | POA: Insufficient documentation

## 2012-06-13 DIAGNOSIS — R509 Fever, unspecified: Secondary | ICD-10-CM | POA: Insufficient documentation

## 2012-06-13 DIAGNOSIS — J069 Acute upper respiratory infection, unspecified: Secondary | ICD-10-CM | POA: Insufficient documentation

## 2012-06-13 DIAGNOSIS — J3489 Other specified disorders of nose and nasal sinuses: Secondary | ICD-10-CM | POA: Insufficient documentation

## 2012-06-13 HISTORY — DX: Migraine, unspecified, not intractable, without status migrainosus: G43.909

## 2012-06-13 LAB — CBC WITH DIFFERENTIAL/PLATELET
Eosinophils Absolute: 0 10*3/uL (ref 0.0–0.7)
Lymphocytes Relative: 8 % — ABNORMAL LOW (ref 12–46)
Lymphs Abs: 0.9 10*3/uL (ref 0.7–4.0)
Neutro Abs: 9 10*3/uL — ABNORMAL HIGH (ref 1.7–7.7)
Neutrophils Relative %: 87 % — ABNORMAL HIGH (ref 43–77)
Platelets: 133 10*3/uL — ABNORMAL LOW (ref 150–400)
RBC: 4.6 MIL/uL (ref 4.22–5.81)
WBC: 10.3 10*3/uL (ref 4.0–10.5)

## 2012-06-13 LAB — COMPREHENSIVE METABOLIC PANEL
ALT: 27 U/L (ref 0–53)
Alkaline Phosphatase: 55 U/L (ref 39–117)
CO2: 27 mEq/L (ref 19–32)
GFR calc Af Amer: >90 mL/min (ref >90)
Glucose, Bld: 124 mg/dL — ABNORMAL HIGH (ref 70–99)
Potassium: 4.2 mEq/L (ref 3.5–5.1)
Sodium: 134 mEq/L — ABNORMAL LOW (ref 135–145)
Total Protein: 7.6 g/dL (ref 6.0–8.3)

## 2012-06-13 MED ORDER — DIPHENHYDRAMINE HCL 50 MG/ML IJ SOLN
25.0000 mg | Freq: Once | INTRAMUSCULAR | Status: AC
  Administered 2012-06-13: 25 mg via INTRAVENOUS
  Filled 2012-06-13: qty 1

## 2012-06-13 MED ORDER — SODIUM CHLORIDE 0.9 % IV SOLN
INTRAVENOUS | Status: DC
  Administered 2012-06-13: 21:00:00 via INTRAVENOUS

## 2012-06-13 MED ORDER — METOCLOPRAMIDE HCL 5 MG/ML IJ SOLN
10.0000 mg | Freq: Once | INTRAMUSCULAR | Status: AC
  Administered 2012-06-13: 10 mg via INTRAVENOUS
  Filled 2012-06-13: qty 2

## 2012-06-13 MED ORDER — ONDANSETRON HCL 4 MG/2ML IJ SOLN: 4.0000 mg | Freq: Once | INTRAMUSCULAR | Status: DC

## 2012-06-13 MED ORDER — DEXAMETHASONE SODIUM PHOSPHATE 10 MG/ML IJ SOLN
10.0000 mg | Freq: Once | INTRAMUSCULAR | Status: AC
  Administered 2012-06-13: 10 mg via INTRAVENOUS
  Filled 2012-06-13: qty 1

## 2012-06-13 NOTE — ED Provider Notes (Signed)
History     CSN: [id number]  Arrival date & time 06/13/12  1840   First MD Initiated Contact with Patient 06/13/12 1943      No chief complaint on file.   (Consider location/radiation/quality/duration/timing/severity/associated sxs/prior treatment) Patient is a 32 y.o. male presenting with migraines. The history is provided by the patient.  Migraine This is a recurrent problem. Episode onset: Patient had onset of a migraine headache yesterday. He feels it in his forehead and the temples. It is a standard headache for him. He said recurrent vomiting because of that. Also, he has had congestion in his chest, and is coughing up a yellowish sputum. The problem occurs constantly. The problem has not changed since onset.Associated symptoms include headaches. Associated symptoms comments: Cough, fever.. Nothing aggravates the symptoms. Nothing relieves the symptoms. Treatments tried: He has been able to hold no medicines down.    Past Medical History  Diagnosis Date  . Migraines   . Hypothyroid     History reviewed. No pertinent past surgical history.  No family history on file.  History  Substance Use Topics  . Smoking status: Never Smoker   . Smokeless tobacco: Not on file  . Alcohol Use: No      Review of Systems  Constitutional: Negative for fever and chills.  Eyes: Negative.   Respiratory: Positive for cough.   Cardiovascular: Negative.   Gastrointestinal: Negative.   Endocrine: Negative.        He is known to be hypothyroid.  Genitourinary: Negative.   Musculoskeletal: Negative.   Skin: Negative.   Neurological: Positive for headaches.  Psychiatric/Behavioral: Negative.     Allergies  Review of patient's allergies indicates no known allergies.  Home Medications   Current Outpatient Rx  Name  Route  Sig  Dispense  Refill  . levothyroxine (SYNTHROID, LEVOTHROID) 50 MCG tablet   Oral   Take 50 mcg by mouth daily.           Marland Kitchen oxycodone (ROXICODONE) 30 MG  immediate release tablet   Oral   Take 30 mg by mouth every 4 (four) hours as needed. For pain            BP 133/83  Pulse 100  Temp(Src) 99.5 F (37.5 C) (Oral)  Resp 16  SpO2 97%  Physical Exam  Nursing note and vitals reviewed. Constitutional: He is oriented to person, place, and time. He appears well-developed and well-nourished.  He has a red faced appearance.  HENT:  Head: Normocephalic and atraumatic.  Right Ear: External ear normal.  Left Ear: External ear normal.  Mouth/Throat: Oropharynx is clear and moist.  Eyes: Conjunctivae and EOM are normal. Pupils are equal, round, and reactive to light.  Neck: Normal range of motion. Neck supple.  Cardiovascular: Normal rate, regular rhythm and normal heart sounds.   Pulmonary/Chest: Effort normal and breath sounds normal.  Abdominal: Soft. Bowel sounds are normal.  Musculoskeletal: Normal range of motion.  Neurological: He is alert and oriented to person, place, and time.  No sensory or motor deficit.    Skin: Skin is warm and dry.  Psychiatric: He has a normal mood and affect. His behavior is normal.    ED Course  Procedures (including critical care time)  Labs Reviewed  CBC WITH DIFFERENTIAL  COMPREHENSIVE METABOLIC PANEL   4:09 PM Pt seen --> physical exam performed.  Lab workup ordered.  IV fluid, migraine cocktail ordered.  Results for orders placed during the hospital encounter of 06/13/12  CBC  WITH DIFFERENTIAL      Result Value Range   WBC 10.3  4.0 - 10.5 K/uL   RBC 4.60  4.22 - 5.81 MIL/uL   Hemoglobin 13.6  13.0 - 17.0 g/dL   HCT 16.1  09.6 - 04.5 %   MCV 85.2  78.0 - 100.0 fL   MCH 29.6  26.0 - 34.0 pg   MCHC 34.7  30.0 - 36.0 g/dL   RDW 40.9  81.1 - 91.4 %   Platelets 133 (*) 150 - 400 K/uL   Neutrophils Relative 87 (*) 43 - 77 %   Neutro Abs 9.0 (*) 1.7 - 7.7 K/uL   Lymphocytes Relative 8 (*) 12 - 46 %   Lymphs Abs 0.9  0.7 - 4.0 K/uL   Monocytes Relative 4  3 - 12 %   Monocytes Absolute  0.4  0.1 - 1.0 K/uL   Eosinophils Relative 0  0 - 5 %   Eosinophils Absolute 0.0  0.0 - 0.7 K/uL   Basophils Relative 0  0 - 1 %   Basophils Absolute 0.0  0.0 - 0.1 K/uL  COMPREHENSIVE METABOLIC PANEL      Result Value Range   Sodium 134 (*) 135 - 145 mEq/L   Potassium 4.2  3.5 - 5.1 mEq/L   Chloride 97  96 - 112 mEq/L   CO2 27  19 - 32 mEq/L   Glucose, Bld 124 (*) 70 - 99 mg/dL   BUN 14  6 - 23 mg/dL   Creatinine, Ser 7.82  0.50 - 1.35 mg/dL   Calcium 9.5  8.4 - 95.6 mg/dL   Total Protein 7.6  6.0 - 8.3 g/dL   Albumin 3.8  3.5 - 5.2 g/dL   AST 30  0 - 37 U/L   ALT 27  0 - 53 U/L   Alkaline Phosphatase 55  39 - 117 U/L   Total Bilirubin 0.3  0.3 - 1.2 mg/dL   GFR calc non Af Amer >90  >90 mL/min   GFR calc Af Amer >90  >90 mL/min   Dg Chest 2 View  06/13/2012  *RADIOLOGY REPORT*  Clinical Data: Cough and fever.  CHEST - 2 VIEW  Comparison: CT chest 03/15/2010.  Findings: The heart size is normal.  The lungs are clear.  The visualized soft tissues and bony thorax are unremarkable.  IMPRESSION: Negative chest.   Original Report Authenticated By: Marin Roberts, M.D.    Lab workup is negative.  Pt feels better after receiving migraine cocktail.  Released.   1. Migraine headache   2. Upper respiratory infection           Carleene Cooper III, MD 06/14/12 1057

## 2012-06-13 NOTE — ED Notes (Signed)
Pt complaining of a headache for the past 2 days, states he has not been able to keep anything down since then.  Complaining of congestion and chest pressure as well.  States he took a suppository in attempt to keep food down, denies any constipation.

## 2012-06-13 NOTE — ED Provider Notes (Signed)
History     CSN: [id number]  Arrival date & time 06/13/12  1840   First MD Initiated Contact with Patient 06/13/12 1943      No chief complaint on file.   (Consider location/radiation/quality/duration/timing/severity/associated sxs/prior treatment) HPI  Past Medical History  Diagnosis Date  . Migraines   . Hypothyroid     History reviewed. No pertinent past surgical history.  No family history on file.  History  Substance Use Topics  . Smoking status: Never Smoker   . Smokeless tobacco: Not on file  . Alcohol Use: No      Review of Systems  Allergies  Review of patient's allergies indicates no known allergies.  Home Medications   Current Outpatient Rx  Name  Route  Sig  Dispense  Refill  . levothyroxine (SYNTHROID, LEVOTHROID) 50 MCG tablet   Oral   Take 50 mcg by mouth daily.           Marland Kitchen oxycodone (ROXICODONE) 30 MG immediate release tablet   Oral   Take 30 mg by mouth every 4 (four) hours as needed. For pain            BP 133/83  Pulse 100  Temp(Src) 99.5 F (37.5 C) (Oral)  Resp 16  SpO2 97%  Physical Exam  ED Course  Procedures (including critical care time)  Labs Reviewed  CBC WITH DIFFERENTIAL - Abnormal; Notable for the following:    Platelets 133 (*)    Neutrophils Relative 87 (*)    Neutro Abs 9.0 (*)    Lymphocytes Relative 8 (*)    All other components within normal limits  COMPREHENSIVE METABOLIC PANEL - Abnormal; Notable for the following:    Sodium 134 (*)    Glucose, Bld 124 (*)    All other components within normal limits   Dg Chest 2 View  06/13/2012  *RADIOLOGY REPORT*  Clinical Data: Cough and fever.  CHEST - 2 VIEW  Comparison: CT chest 03/15/2010.  Findings: The heart size is normal.  The lungs are clear.  The visualized soft tissues and bony thorax are unremarkable.  IMPRESSION: Negative chest.   Original Report Authenticated By: Marin Roberts, M.D.    9:59 PM Results for orders placed during the hospital  encounter of 06/13/12  CBC WITH DIFFERENTIAL      Result Value Range   WBC 10.3  4.0 - 10.5 K/uL   RBC 4.60  4.22 - 5.81 MIL/uL   Hemoglobin 13.6  13.0 - 17.0 g/dL   HCT 11.9  14.7 - 82.9 %   MCV 85.2  78.0 - 100.0 fL   MCH 29.6  26.0 - 34.0 pg   MCHC 34.7  30.0 - 36.0 g/dL   RDW 56.2  13.0 - 86.5 %   Platelets 133 (*) 150 - 400 K/uL   Neutrophils Relative 87 (*) 43 - 77 %   Neutro Abs 9.0 (*) 1.7 - 7.7 K/uL   Lymphocytes Relative 8 (*) 12 - 46 %   Lymphs Abs 0.9  0.7 - 4.0 K/uL   Monocytes Relative 4  3 - 12 %   Monocytes Absolute 0.4  0.1 - 1.0 K/uL   Eosinophils Relative 0  0 - 5 %   Eosinophils Absolute 0.0  0.0 - 0.7 K/uL   Basophils Relative 0  0 - 1 %   Basophils Absolute 0.0  0.0 - 0.1 K/uL  COMPREHENSIVE METABOLIC PANEL      Result Value Range   Sodium 134 (*)  135 - 145 mEq/L   Potassium 4.2  3.5 - 5.1 mEq/L   Chloride 97  96 - 112 mEq/L   CO2 27  19 - 32 mEq/L   Glucose, Bld 124 (*) 70 - 99 mg/dL   BUN 14  6 - 23 mg/dL   Creatinine, Ser 9.60  0.50 - 1.35 mg/dL   Calcium 9.5  8.4 - 45.4 mg/dL   Total Protein 7.6  6.0 - 8.3 g/dL   Albumin 3.8  3.5 - 5.2 g/dL   AST 30  0 - 37 U/L   ALT 27  0 - 53 U/L   Alkaline Phosphatase 55  39 - 117 U/L   Total Bilirubin 0.3  0.3 - 1.2 mg/dL   GFR calc non Af Amer >90  >90 mL/min   GFR calc Af Amer >90  >90 mL/min   10:00 PM Lab tests are essentially negative.  Pt feels much better after medication.  I advised him to rest in the darkened room tonight.  He should treat his URI symptoms with fluids and Tylenol.     1. Migraine headache   2. Upper respiratory infection            Carleene Cooper III, MD 06/13/12 2207

## 2012-06-13 NOTE — ED Notes (Signed)
Pt c/o nasal congestion, emesis, headache and fever of 103 since Tues (presently 99.1).

## 2012-06-13 NOTE — ED Notes (Signed)
Patient transported to X-ray 

## 2012-07-24 ENCOUNTER — Other Ambulatory Visit: Payer: Self-pay

## 2012-07-24 ENCOUNTER — Emergency Department (HOSPITAL_COMMUNITY)
Admission: EM | Admit: 2012-07-24 | Discharge: 2012-07-24 | Disposition: A | Discharge status: Home / Self Care | Payer: Worker's Compensation | Attending: Emergency Medicine | Admitting: Emergency Medicine

## 2012-07-24 ENCOUNTER — Encounter (HOSPITAL_COMMUNITY): Payer: Self-pay | Admitting: Emergency Medicine

## 2012-07-24 DIAGNOSIS — E039 Hypothyroidism, unspecified: Secondary | ICD-10-CM | POA: Insufficient documentation

## 2012-07-24 DIAGNOSIS — R002 Palpitations: Secondary | ICD-10-CM | POA: Insufficient documentation

## 2012-07-24 DIAGNOSIS — Z79899 Other long term (current) drug therapy: Secondary | ICD-10-CM | POA: Insufficient documentation

## 2012-07-24 DIAGNOSIS — R000 Tachycardia, unspecified: Secondary | ICD-10-CM | POA: Insufficient documentation

## 2012-07-24 DIAGNOSIS — Z0000 Encounter for general adult medical examination without abnormal findings: Secondary | ICD-10-CM

## 2012-07-24 DIAGNOSIS — Z008 Encounter for other general examination: Secondary | ICD-10-CM | POA: Insufficient documentation

## 2012-07-24 DIAGNOSIS — Z566 Other physical and mental strain related to work: Secondary | ICD-10-CM

## 2012-07-24 DIAGNOSIS — F430 Acute stress reaction: Secondary | ICD-10-CM | POA: Insufficient documentation

## 2012-07-24 DIAGNOSIS — G43909 Migraine, unspecified, not intractable, without status migrainosus: Secondary | ICD-10-CM | POA: Insufficient documentation

## 2012-07-24 MED ORDER — SUMATRIPTAN SUCCINATE 100 MG PO TABS: 100.0000 mg | ORAL_TABLET | ORAL | Status: DC | PRN
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
## 2012-07-24 NOTE — ED Provider Notes (Signed)
Medical screening examination/treatment/procedure(s) were performed by non-physician practitioner and as supervising physician I was immediately available for consultation/collaboration.    Celene Kras, MD 07/24/12 [phone]

## 2013-03-21 ENCOUNTER — Emergency Department (HOSPITAL_COMMUNITY): Payer: BC Managed Care – PPO

## 2013-03-21 ENCOUNTER — Emergency Department (HOSPITAL_COMMUNITY)
Admission: EM | Admit: 2013-03-21 | Discharge: 2013-03-21 | Disposition: A | Discharge status: Home / Self Care | Payer: BC Managed Care – PPO | Attending: Emergency Medicine | Admitting: Emergency Medicine

## 2013-03-21 ENCOUNTER — Emergency Department (INDEPENDENT_AMBULATORY_CARE_PROVIDER_SITE_OTHER)
Admission: EM | Admit: 2013-03-21 | Discharge: 2013-03-21 | Disposition: A | Discharge status: Nursing Home (Includes ICF) | Payer: BC Managed Care – PPO | Source: Home / Self Care | Attending: Family Medicine | Admitting: Family Medicine

## 2013-03-21 ENCOUNTER — Encounter (HOSPITAL_COMMUNITY): Payer: Self-pay | Admitting: Emergency Medicine

## 2013-03-21 DIAGNOSIS — Z8679 Personal history of other diseases of the circulatory system: Secondary | ICD-10-CM | POA: Insufficient documentation

## 2013-03-21 DIAGNOSIS — R1031 Right lower quadrant pain: Secondary | ICD-10-CM

## 2013-03-21 DIAGNOSIS — R509 Fever, unspecified: Secondary | ICD-10-CM | POA: Insufficient documentation

## 2013-03-21 DIAGNOSIS — R109 Unspecified abdominal pain: Secondary | ICD-10-CM

## 2013-03-21 DIAGNOSIS — Z79899 Other long term (current) drug therapy: Secondary | ICD-10-CM | POA: Insufficient documentation

## 2013-03-21 DIAGNOSIS — E039 Hypothyroidism, unspecified: Secondary | ICD-10-CM | POA: Insufficient documentation

## 2013-03-21 DIAGNOSIS — R1011 Right upper quadrant pain: Secondary | ICD-10-CM | POA: Insufficient documentation

## 2013-03-21 LAB — URINALYSIS, ROUTINE W REFLEX MICROSCOPIC
Glucose, UA: NEGATIVE mg/dL
Leukocytes, UA: NEGATIVE
Nitrite: NEGATIVE
Specific Gravity, Urine: 1.015 (ref 1.005–1.030)
pH: 6 (ref 5.0–8.0)

## 2013-03-21 LAB — CBC WITH DIFFERENTIAL/PLATELET
Basophils Absolute: 0 10*3/uL (ref 0.0–0.1)
Basophils Relative: 0 % (ref 0–1)
Eosinophils Absolute: 0 10*3/uL (ref 0.0–0.7)
Eosinophils Relative: 0 % (ref 0–5)
HCT: 41.2 % (ref 39.0–52.0)
MCHC: 34.2 g/dL (ref 30.0–36.0)
Monocytes Absolute: 0.6 10*3/uL (ref 0.1–1.0)
Neutro Abs: 8.9 10*3/uL — ABNORMAL HIGH (ref 1.7–7.7)
RDW: 12.2 % (ref 11.5–15.5)

## 2013-03-21 LAB — COMPREHENSIVE METABOLIC PANEL
AST: 21 U/L (ref 0–37)
Albumin: 4.3 g/dL (ref 3.5–5.2)
Calcium: 9.8 mg/dL (ref 8.4–10.5)
Chloride: 98 mEq/L (ref 96–112)
Creatinine, Ser: 1.1 mg/dL (ref 0.50–1.35)
Total Protein: 7.8 g/dL (ref 6.0–8.3)

## 2013-03-21 LAB — URINE MICROSCOPIC-ADD ON

## 2013-03-21 MED ORDER — IOHEXOL 300 MG/ML  SOLN: 100.0000 mL | Freq: Once | INTRAMUSCULAR | Status: DC | PRN

## 2013-03-21 MED ORDER — IOHEXOL 300 MG/ML  SOLN
25.0000 mL | INTRAMUSCULAR | Status: AC
  Administered 2013-03-21: 25 mL via ORAL

## 2013-03-21 NOTE — ED Notes (Signed)
Right abdominal pain for 1 week, denies vomiting

## 2013-03-21 NOTE — ED Provider Notes (Signed)
  Physical Exam  BP 117/70  Pulse 70  Temp(Src) 99.4 F (37.4 C) (Oral)  Resp 18  Ht 5\' 8"  (1.727 m)  Wt 184 lb 2 oz (83.519 kg)  BMI 28.00 kg/m2  SpO2 100%  Physical Exam  ED Course  Procedures  Patient has been given the results of his tests. He is advised that this could be an evolving process and to return here for any worsening his condition.       Carlyle Dolly, PA-C 03/21/13 1841

## 2013-03-21 NOTE — ED Provider Notes (Signed)
Medical screening examination/treatment/procedure(s) were performed by non-physician practitioner and as supervising physician I was immediately available for consultation/collaboration.   Celene Kras, MD 03/21/13 [phone]

## 2013-03-21 NOTE — ED Notes (Signed)
Vitals taken by Meredith ([employer]) 

## 2013-03-21 NOTE — ED Provider Notes (Signed)
CSN: [id number]     Arrival date & time 03/21/13  1312 History   First MD Initiated Contact with Patient 03/21/13 1322     Chief Complaint  Patient presents with  . Abdominal Pain   (Consider location/radiation/quality/duration/timing/severity/associated sxs/prior Treatment) Patient is a 32 y.o. male presenting with abdominal pain. The history is provided by the patient. No language interpreter was used.  Abdominal Pain Pain location:  RUQ Associated symptoms: fever   Associated symptoms: no chest pain, no nausea, no shortness of breath and no vomiting   Associated symptoms comment:  RUQ abdominal pain that started one week ago, progressively worse over this afternoon. No N, V. He has no diarrhea. He cannot say whether or not the pain fluctuates with eating, but reports a decreased appetite. No urinary symptoms.    Past Medical History  Diagnosis Date  . Migraines   . Hypothyroid    History reviewed. No pertinent past surgical history. History reviewed. No pertinent family history. History  Substance Use Topics  . Smoking status: Never Smoker   . Smokeless tobacco: Not on file  . Alcohol Use: No    Review of Systems  Constitutional: Positive for fever and appetite change.       Low grade temperature today.  Respiratory: Negative.  Negative for shortness of breath.   Cardiovascular: Negative.  Negative for chest pain.  Gastrointestinal: Positive for abdominal pain. Negative for nausea, vomiting and blood in stool.  Musculoskeletal: Negative.  Negative for myalgias.  Skin: Negative for color change.  Neurological: Negative for weakness.    Allergies  Review of patient's allergies indicates no known allergies.  Home Medications   Current Outpatient Rx  Name  Route  Sig  Dispense  Refill  . levothyroxine (SYNTHROID, LEVOTHROID) 50 MCG tablet   Oral   Take 50 mcg by mouth daily.           Marland Kitchen oxycodone (ROXICODONE) 30 MG immediate release tablet   Oral   Take 30 mg  by mouth every 4 (four) hours as needed. For pain           BP 117/75  Pulse 71  Temp(Src) 99.4 F (37.4 C) (Oral)  Resp 18  Ht 5\' 8"  (1.727 m)  Wt 184 lb 2 oz (83.519 kg)  BMI 28.00 kg/m2  SpO2 100% Physical Exam  Constitutional: He is oriented to person, place, and time. He appears well-developed and well-nourished.  HENT:  Head: Normocephalic.  Neck: Normal range of motion. Neck supple.  Cardiovascular: Normal rate and regular rhythm.   Pulmonary/Chest: Effort normal and breath sounds normal.  Abdominal: Soft. Bowel sounds are normal. There is tenderness. There is no rebound and no guarding.  RUQ tenderness to palpation. No RLQ pain or tenderness.   Musculoskeletal: Normal range of motion.  Neurological: He is alert and oriented to person, place, and time.  Skin: Skin is warm and dry. No rash noted.  Psychiatric: He has a normal mood and affect.    ED Course  Procedures (including critical care time) Labs Review Labs Reviewed  CBC WITH DIFFERENTIAL - Abnormal; Notable for the following:    WBC 11.2 (*)    Neutrophils Relative % 80 (*)    Neutro Abs 8.9 (*)    All other components within normal limits  COMPREHENSIVE METABOLIC PANEL  LIPASE, BLOOD   Imaging Review No results found.  EKG Interpretation   None       MDM  No diagnosis found. Abdominal pain  Plan: Abdominal ultrasound to evaluate for cholecystitis. If negative, consider CT scan for other cause leukocytosis and abdominal pain. Patient care transferred to [facility], PA-C.    Arnoldo Hooker, PA-C 03/21/13 1512

## 2013-03-21 NOTE — ED Notes (Signed)
Pt sent here from [facility] for eval of right sided abd pain x 1 week; pt sts some nausea today

## 2013-03-21 NOTE — ED Provider Notes (Signed)
Marland Reine is a 32 y.o. male who presents to Urgent Care today for right lower corner and abdominal pain. The patient is a diffuse abdominal pain present for the last week. It is worse and localized to the right lower quadrant over the past 2 days. He notes nausea but denies any vomiting or diarrhea. He notes decreased appetite. He takes oxycodone for severe hip pain that is currently planned for surgery. He does have some constipation but that has not changed recently. He is well otherwise. He denies any injury. He denies any history of appendicitis or appendectomy.    Past Medical History  Diagnosis Date  . Migraines   . Hypothyroid    History  Substance Use Topics  . Smoking status: Never Smoker   . Smokeless tobacco: Not on file  . Alcohol Use: No   ROS as above Medications reviewed. No current facility-administered medications for this encounter.   Current Outpatient Prescriptions  Medication Sig Dispense Refill  . levothyroxine (SYNTHROID, LEVOTHROID) 50 MCG tablet Take 50 mcg by mouth daily.        Marland Kitchen oxycodone (ROXICODONE) 30 MG immediate release tablet Take 30 mg by mouth every 4 (four) hours as needed. For pain       . SUMAtriptan (IMITREX) 100 MG tablet Take 1 tablet (100 mg total) by mouth every 2 (two) hours as needed for migraine.  10 tablet  0    Exam:  BP 162/94  Pulse 88  Temp(Src) 98.7 F (37.1 C) (Oral)  SpO2 94% Gen: Well NAD HEENT: EOMI,  MMM Lungs: Normal work of breathing. CTABL Heart: RRR no MRG Abd: Hypoactive bowel sounds. Tender palpation right lower quadrant with guarding and rebound. Negative psoas sign Exts: Non edematous BL  LE, warm and well perfused.   No results found for this or any previous visit (from the past 24 hour(s)). No results found.  Assessment and Plan: 32 y.o. male with right lower quadrant  abdominal pain worsening over the past week with guarding and rebound. This is obviously concerning for appendicitis. Plan to transfer to  the emergency room via shovel for further evaluation and management.  Discussed warning signs or symptoms. Please see discharge instructions. Patient expresses understanding.      Rodolph Bong, MD 03/21/13 1247

## 2013-03-22 NOTE — ED Provider Notes (Signed)
Medical screening examination/treatment/procedure(s) were performed by non-physician practitioner and as supervising physician I was immediately available for consultation/collaboration.  Lyanne Co, MD 03/22/13 [phone]

## 2013-12-24 ENCOUNTER — Encounter: Payer: Self-pay | Admitting: Gastroenterology

## 2014-01-04 ENCOUNTER — Encounter (HOSPITAL_COMMUNITY): Payer: Self-pay | Admitting: Emergency Medicine

## 2014-01-04 ENCOUNTER — Emergency Department (HOSPITAL_COMMUNITY)
Admission: EM | Admit: 2014-01-04 | Discharge: 2014-01-04 | Disposition: A | Discharge status: Home / Self Care | Payer: BC Managed Care – PPO | Source: Home / Self Care | Attending: Family Medicine | Admitting: Family Medicine

## 2014-01-04 DIAGNOSIS — M25519 Pain in unspecified shoulder: Secondary | ICD-10-CM

## 2014-01-04 DIAGNOSIS — G8929 Other chronic pain: Secondary | ICD-10-CM

## 2014-01-04 DIAGNOSIS — M25511 Pain in right shoulder: Principal | ICD-10-CM

## 2014-01-04 MED ORDER — TRAMADOL HCL 50 MG PO TABS: 50.0000 mg | ORAL_TABLET | Freq: Two times a day (BID) | ORAL | Status: DC | PRN

## 2014-01-04 NOTE — ED Provider Notes (Signed)
Medical screening examination/treatment/procedure(s) were performed by resident physician or non-physician practitioner and as supervising physician I was immediately available for consultation/collaboration.   Shereka Lafortune DOUGLAS MD.   Donovyn Guidice D Jillian Warth, MD 01/04/14 1211 

## 2014-01-04 NOTE — Discharge Instructions (Signed)
Chronic Pain  Chronic pain can be defined as pain that is off and on and lasts for 3-6 months or longer. Many things cause chronic pain, which can make it difficult to make a diagnosis. There are many treatment options available for chronic pain. However, finding a treatment that works well for you may require trying various approaches until the right one is found. Many people benefit from a combination of two or more types of treatment to control their pain.  SYMPTOMS   Chronic pain can occur anywhere in the body and can range from mild to very severe. Some types of chronic pain include:   Headache.   Low back pain.   Cancer pain.   Arthritis pain.   Neurogenic pain. This is pain resulting from damage to nerves.  People with chronic pain may also have other symptoms such as:   Depression.   Anger.   Insomnia.   Anxiety.  DIAGNOSIS   Your health care provider will help diagnose your condition over time. In many cases, the initial focus will be on excluding possible conditions that could be causing the pain. Depending on your symptoms, your health care provider may order tests to diagnose your condition. Some of these tests may include:    Blood tests.    CT scan.    MRI.    X-rays.    Ultrasounds.    Nerve conduction studies.   You may need to see a specialist.   TREATMENT   Finding treatment that works well may take time. You may be referred to a pain specialist. He or she may prescribe medicine or therapies, such as:    Mindful meditation or yoga.   Shots (injections) of numbing or pain-relieving medicines into the spine or area of pain.   Local electrical stimulation.   Acupuncture.    Massage therapy.    Aroma, color, light, or sound therapy.    Biofeedback.    Working with a physical therapist to keep from getting stiff.    Regular, gentle exercise.    Cognitive or behavioral therapy.    Group support.   Sometimes, surgery may be recommended.   HOME CARE INSTRUCTIONS     Take all medicines as directed by your health care provider.    Lessen stress in your life by relaxing and doing things such as listening to calming music.    Exercise or be active as directed by your health care provider.    Eat a healthy diet and include things such as vegetables, fruits, fish, and lean meats in your diet.    Keep all follow-up appointments with your health care provider.    Attend a support group with others suffering from chronic pain.  SEEK MEDICAL CARE IF:    Your pain gets worse.    You develop a new pain that was not there before.    You cannot tolerate medicines given to you by your health care provider.    You have new symptoms since your last visit with your health care provider.   SEEK IMMEDIATE MEDICAL CARE IF:    You feel weak.    You have decreased sensation or numbness.    You lose control of bowel or bladder function.    Your pain suddenly gets much worse.    You develop shaking.   You develop chills.   You develop confusion.   You develop chest pain.   You develop shortness of breath.   MAKE SURE YOU:     Understand these instructions.   Will watch your condition.   Will get help right away if you are not doing well or get worse.  Document Released: 01/08/2002 Document Revised: 12/19/2012 Document Reviewed: 10/12/2012  ExitCare Patient Information 2015 [employer]. This information is not intended to replace advice given to you by your health care provider. Make sure you discuss any questions you have with your health care provider.  Shoulder Pain  The shoulder is the joint that connects your arms to your body. The bones that form the shoulder joint include the upper arm bone (humerus), the shoulder blade (scapula), and the collarbone (clavicle). The top of the humerus is shaped like a ball and fits into a rather flat socket on the scapula (glenoid cavity). A combination of muscles and strong, fibrous tissues that connect muscles to bones  (tendons) support your shoulder joint and hold the ball in the socket. Small, fluid-filled sacs (bursae) are located in different areas of the joint. They act as cushions between the bones and the overlying soft tissues and help reduce friction between the gliding tendons and the bone as you move your arm. Your shoulder joint allows a wide range of motion in your arm. This range of motion allows you to do things like scratch your back or throw a ball. However, this range of motion also makes your shoulder more prone to pain from overuse and injury.  Causes of shoulder pain can originate from both injury and overuse and usually can be grouped in the following four categories:   Redness, swelling, and pain (inflammation) of the tendon (tendinitis) or the bursae (bursitis).   Instability, such as a dislocation of the joint.   Inflammation of the joint (arthritis).   Broken bone (fracture).  HOME CARE INSTRUCTIONS    Apply ice to the sore area.   Put ice in a plastic bag.   Place a towel between your skin and the bag.   Leave the ice on for 15-20 minutes, 3-4 times per day for the first 2 days, or as directed by your health care provider.   Stop using cold packs if they do not help with the pain.   If you have a shoulder sling or immobilizer, wear it as long as your caregiver instructs. Only remove it to shower or bathe. Move your arm as little as possible, but keep your hand moving to prevent swelling.   Squeeze a soft ball or foam pad as much as possible to help prevent swelling.   Only take over-the-counter or prescription medicines for pain, discomfort, or fever as directed by your caregiver.  SEEK MEDICAL CARE IF:    Your shoulder pain increases, or new pain develops in your arm, hand, or fingers.   Your hand or fingers become cold and numb.   Your pain is not relieved with medicines.  SEEK IMMEDIATE MEDICAL CARE IF:    Your arm, hand, or fingers are numb or tingling.   Your arm, hand, or fingers  are significantly swollen or turn white or blue.  MAKE SURE YOU:    Understand these instructions.   Will watch your condition.   Will get help right away if you are not doing well or get worse.  Document Released: 01/26/2005 Document Revised: 09/02/2013 Document Reviewed: 04/02/2011  ExitCare Patient Information 2015 [employer]. This information is not intended to replace advice given to you by your health care provider. Make sure you discuss any questions you have with your health care   provider.

## 2014-01-04 NOTE — ED Notes (Signed)
C/o right shoulder pain.  Hx of right shoulder injury years ago.  Dx of bursitis.  States "flare up for the past two weeks but pain has become severe within the past two days".  Denies re injury.    No relief with otc meds.

## 2014-01-04 NOTE — ED Provider Notes (Signed)
CSN: [id number]     Arrival date & time 01/04/14  4098 History   First MD Initiated Contact with Patient 01/04/14 1022     Chief Complaint  Patient presents with  . Shoulder Pain   (Consider location/radiation/quality/duration/timing/severity/associated sxs/prior Treatment) HPI Comments: Hx chronic right shoulder pain currently being followed by Dr. Kirtland Bouchard. Supple @ [facility]. State he sees Dr. Rennis Chris weekly for prescription for Tramadol and will soon begin physical therapy and is in discussion with Dr. Rennis Chris about possible shoulder surgery. States he had intraarticular right shoulder injection 10 days ago and has run out of his tramadol. Is here requesting tramadol refill. Has also tried taking etodolac with little relief.    Patient is a 33 y.o. male presenting with shoulder pain. The history is provided by the patient.  Shoulder Pain This is a chronic problem. The problem occurs daily. The problem has not changed since onset.   Past Medical History  Diagnosis Date  . Migraines   . Hypothyroid    History reviewed. No pertinent past surgical history. History reviewed. No pertinent family history. History  Substance Use Topics  . Smoking status: Never Smoker   . Smokeless tobacco: Not on file  . Alcohol Use: No    Review of Systems  All other systems reviewed and are negative.   Allergies  Review of patient's allergies indicates no known allergies.  Home Medications   Prior to Admission medications   Medication Sig Start Date End Date Taking? Authorizing Provider  levothyroxine (SYNTHROID, LEVOTHROID) 50 MCG tablet Take 50 mcg by mouth daily.     Yes Historical Provider, MD  oxycodone (ROXICODONE) 30 MG immediate release tablet Take 30 mg by mouth every 4 (four) hours as needed. For pain     Historical Provider, MD  traMADol (ULTRAM) 50 MG tablet Take 1 tablet (50 mg total) by mouth every 12 (twelve) hours as needed for moderate pain. 01/04/14   Jess Barters H Milton Sagona,  PA   BP 137/81  Pulse 81  Temp(Src) 98.1 F (36.7 C) (Oral)  Resp 16  SpO2 99% Physical Exam  Nursing note and vitals reviewed. Constitutional: He is oriented to person, place, and time. He appears well-developed and well-nourished. No distress.  HENT:  Head: Normocephalic and atraumatic.  Cardiovascular: Normal rate, regular rhythm and normal heart sounds.   Pulmonary/Chest: Effort normal and breath sounds normal.  Musculoskeletal:       Right shoulder: He exhibits decreased range of motion and tenderness. He exhibits no bony tenderness, no swelling, no effusion, no crepitus, no deformity, no laceration, normal pulse and normal strength.  Pain with abduction past 90 degrees and overhead reach. Neurovascular exam normal. Palpable pain at lateral shoulder at [facility] joint. No deformity or skin changes.   Neurological: He is alert and oriented to person, place, and time.  Skin: Skin is warm and dry. No rash noted. No erythema.  Psychiatric: He has a normal mood and affect. His behavior is normal.    ED Course  Procedures (including critical care time) Labs Review Labs Reviewed - No data to display  Imaging Review No results found.   MDM   1. Chronic right shoulder pain    Provided patient with tramadol 50 mg tablets (#6) and was instructed to follow up with Dr. Rennis Chris for further management.    Ria Clock, [place] 01/04/14 [phone]

## 2014-03-30 ENCOUNTER — Emergency Department (HOSPITAL_COMMUNITY): Payer: BC Managed Care – PPO

## 2014-03-30 ENCOUNTER — Emergency Department (HOSPITAL_COMMUNITY)
Admission: EM | Admit: 2014-03-30 | Discharge: 2014-03-30 | Disposition: A | Discharge status: Home / Self Care | Payer: BC Managed Care – PPO | Attending: Emergency Medicine | Admitting: Emergency Medicine

## 2014-03-30 ENCOUNTER — Encounter (HOSPITAL_COMMUNITY): Payer: Self-pay | Admitting: *Deleted

## 2014-03-30 DIAGNOSIS — G8929 Other chronic pain: Secondary | ICD-10-CM | POA: Diagnosis not present

## 2014-03-30 DIAGNOSIS — X58XXXA Exposure to other specified factors, initial encounter: Secondary | ICD-10-CM | POA: Insufficient documentation

## 2014-03-30 DIAGNOSIS — Y9289 Other specified places as the place of occurrence of the external cause: Secondary | ICD-10-CM | POA: Insufficient documentation

## 2014-03-30 DIAGNOSIS — E039 Hypothyroidism, unspecified: Secondary | ICD-10-CM | POA: Diagnosis not present

## 2014-03-30 DIAGNOSIS — Z79899 Other long term (current) drug therapy: Secondary | ICD-10-CM | POA: Diagnosis not present

## 2014-03-30 DIAGNOSIS — S3992XA Unspecified injury of lower back, initial encounter: Secondary | ICD-10-CM | POA: Diagnosis present

## 2014-03-30 DIAGNOSIS — Y998 Other external cause status: Secondary | ICD-10-CM | POA: Diagnosis not present

## 2014-03-30 DIAGNOSIS — S39012A Strain of muscle, fascia and tendon of lower back, initial encounter: Secondary | ICD-10-CM | POA: Insufficient documentation

## 2014-03-30 DIAGNOSIS — Z8679 Personal history of other diseases of the circulatory system: Secondary | ICD-10-CM | POA: Diagnosis not present

## 2014-03-30 DIAGNOSIS — Y9389 Activity, other specified: Secondary | ICD-10-CM | POA: Insufficient documentation

## 2014-03-30 DIAGNOSIS — K529 Noninfective gastroenteritis and colitis, unspecified: Secondary | ICD-10-CM | POA: Diagnosis not present

## 2014-03-30 DIAGNOSIS — M549 Dorsalgia, unspecified: Secondary | ICD-10-CM

## 2014-03-30 LAB — CBC WITH DIFFERENTIAL/PLATELET
Basophils Absolute: 0 10*3/uL (ref 0.0–0.1)
Basophils Relative: 0 % (ref 0–1)
EOS PCT: 0 % (ref 0–5)
Eosinophils Absolute: 0 10*3/uL (ref 0.0–0.7)
HEMATOCRIT: 45.6 % (ref 39.0–52.0)
HEMOGLOBIN: 15.4 g/dL (ref 13.0–17.0)
Lymphocytes Relative: 10 % — ABNORMAL LOW (ref 12–46)
Lymphs Abs: 1.1 10*3/uL (ref 0.7–4.0)
MCH: 29.1 pg (ref 26.0–34.0)
MCHC: 33.8 g/dL (ref 30.0–36.0)
MCV: 86 fL (ref 78.0–100.0)
Monocytes Absolute: 0.4 10*3/uL (ref 0.1–1.0)
Monocytes Relative: 4 % (ref 3–12)
Neutro Abs: 9.4 10*3/uL — ABNORMAL HIGH (ref 1.7–7.7)
Neutrophils Relative %: 86 % — ABNORMAL HIGH (ref 43–77)
Platelets: 171 10*3/uL (ref 150–400)
RBC: 5.3 MIL/uL (ref 4.22–5.81)
RDW: 12.7 % (ref 11.5–15.5)
WBC: 11 10*3/uL — AB (ref 4.0–10.5)

## 2014-03-30 LAB — URINALYSIS, ROUTINE W REFLEX MICROSCOPIC
Glucose, UA: NEGATIVE mg/dL
Ketones, ur: 40 mg/dL — AB
Nitrite: NEGATIVE
PH: 5.5 (ref 5.0–8.0)
Protein, ur: 30 mg/dL — AB
SPECIFIC GRAVITY, URINE: 1.03 (ref 1.005–1.030)
Urobilinogen, UA: 0.2 mg/dL (ref 0.0–1.0)

## 2014-03-30 LAB — COMPREHENSIVE METABOLIC PANEL
ALT: 13 U/L (ref 0–53)
AST: 20 U/L (ref 0–37)
Albumin: 4.7 g/dL (ref 3.5–5.2)
Alkaline Phosphatase: 63 U/L (ref 39–117)
Anion gap: 16 — ABNORMAL HIGH (ref 5–15)
BUN: 8 mg/dL (ref 6–23)
CO2: 25 meq/L (ref 19–32)
CREATININE: 0.95 mg/dL (ref 0.50–1.35)
Calcium: 10.4 mg/dL (ref 8.4–10.5)
Chloride: 97 mEq/L (ref 96–112)
GLUCOSE: 117 mg/dL — AB (ref 70–99)
Potassium: 4 mEq/L (ref 3.7–5.3)
SODIUM: 138 meq/L (ref 137–147)
Total Bilirubin: 0.7 mg/dL (ref 0.3–1.2)
Total Protein: 8.3 g/dL (ref 6.0–8.3)

## 2014-03-30 LAB — RAPID URINE DRUG SCREEN, HOSP PERFORMED
Amphetamines: POSITIVE — AB
Barbiturates: NOT DETECTED
Benzodiazepines: POSITIVE — AB
Cocaine: NOT DETECTED
Opiates: NOT DETECTED
TETRAHYDROCANNABINOL: NOT DETECTED

## 2014-03-30 LAB — URINE MICROSCOPIC-ADD ON

## 2014-03-30 LAB — LIPASE, BLOOD: Lipase: 12 U/L (ref 11–59)

## 2014-03-30 MED ORDER — IOHEXOL 300 MG/ML  SOLN: 100.0000 mL | Freq: Once | INTRAMUSCULAR | Status: DC | PRN

## 2014-03-30 MED ORDER — METRONIDAZOLE 500 MG PO TABS: 500.0000 mg | ORAL_TABLET | Freq: Three times a day (TID) | ORAL | Status: DC

## 2014-03-30 MED ORDER — IOHEXOL 300 MG/ML  SOLN
25.0000 mL | INTRAMUSCULAR | Status: DC | PRN
  Administered 2014-03-30: 25 mL via ORAL
  Filled 2014-03-30: qty 30

## 2014-03-30 MED ORDER — OXYCODONE-ACETAMINOPHEN 5-325 MG PO TABS
1.0000 | ORAL_TABLET | Freq: Once | ORAL | Status: AC
  Administered 2014-03-30: 1 via ORAL
  Filled 2014-03-30: qty 1

## 2014-03-30 MED ORDER — CIPROFLOXACIN HCL 500 MG PO TABS: 500.0000 mg | ORAL_TABLET | Freq: Two times a day (BID) | ORAL | Status: DC

## 2014-03-30 MED ORDER — ONDANSETRON 4 MG PO TBDP: ORAL_TABLET | ORAL | Status: DC

## 2014-03-30 NOTE — ED Notes (Signed)
Declined W/C at D/C and was escorted to lobby by RN. 

## 2014-03-30 NOTE — ED Notes (Signed)
Pt reports lower back pain x 3 days. Denies injury to back. Denies urinary symptoms. Ambulatory at triage.

## 2014-03-30 NOTE — Discharge Instructions (Signed)
Please call your doctor for a followup appointment within 24-48 hours. When you talk to your doctor please let them know that you were seen in the emergency department and have them acquire all of your records so that they can discuss the findings with you and formulate a treatment plan to fully care for your new and ongoing problems. Please call and set-up an appointment with your primary care provider Please call and follow-up with Dr. Rennis ChrisSupple, Orthopedics Please call and set up an appointment with gastric tetralogy Please rest and stay hydrated Please take medications on a full stomach  Please avoid any physical or strenuous activity Please massage with icy hot ointment  Please continue to monitor symptoms closely and if symptoms are to worsen or change (fever greater than 101, chills, sweating, nausea, vomiting, chest pain, shortness of breathe, difficulty breathing, weakness, numbness, tingling, worsening or changes to pain pattern, blood in the stools, black tarry stools, inability to control urine or bowel movements, swelling, fall, injury, inability keep any food or fluids down) please report back to the Emergency Department immediately.   Colitis Colitis is inflammation of the colon. Colitis can be a short-term or long-standing (chronic) illness. Crohn's disease and ulcerative colitis are 2 types of colitis which are chronic. They usually require lifelong treatment. CAUSES  There are many different causes of colitis, including:  Viruses.  Germs (bacteria).  Medicine reactions. SYMPTOMS   Diarrhea.  Intestinal bleeding.  Pain.  Fever.  Throwing up (vomiting).  Tiredness (fatigue).  Weight loss.  Bowel blockage. DIAGNOSIS  The diagnosis of colitis is based on examination and stool or blood tests. X-rays, CT scan, and colonoscopy may also be needed. TREATMENT  Treatment may include:  Fluids given through the vein (intravenously).  Bowel rest (nothing to eat or drink  for a period of time).  Medicine for pain and diarrhea.  Medicines (antibiotics) that kill germs.  Cortisone medicines.  Surgery. HOME CARE INSTRUCTIONS   Get plenty of rest.  Drink enough water and fluids to keep your urine clear or pale yellow.  Eat a well-balanced diet.  Call your caregiver for follow-up as recommended. SEEK IMMEDIATE MEDICAL CARE IF:   You develop chills.  You have an oral temperature above 102 F (38.9 C), not controlled by medicine.  You have extreme weakness, fainting, or dehydration.  You have repeated vomiting.  You develop severe belly (abdominal) pain or are passing bloody or tarry stools. MAKE SURE YOU:   Understand these instructions.  Will watch your condition.  Will get help right away if you are not doing well or get worse. Document Released: 05/26/2004 Document Revised: 07/11/2011 Document Reviewed: 08/21/2009 [facility] Patient Information 2015 [place], [place]. This information is not intended to replace advice given to you by your health care provider. Make sure you discuss any questions you have with your health care provider.  Lumbosacral Strain Lumbosacral strain is a strain of any of the parts that make up your lumbosacral vertebrae. Your lumbosacral vertebrae are the bones that make up the lower third of your backbone. Your lumbosacral vertebrae are held together by muscles and tough, fibrous tissue (ligaments).  CAUSES  A sudden blow to your back can cause lumbosacral strain. Also, anything that causes an excessive stretch of the muscles in the low back can cause this strain. This is typically seen when people exert themselves strenuously, fall, lift heavy objects, bend, or crouch repeatedly. RISK FACTORS  Physically demanding work.  Participation in pushing or pulling sports or  sports that require a sudden twist of the back (tennis, golf, baseball).  Weight lifting.  Excessive lower back curvature.  Forward-tilted  pelvis.  Weak back or abdominal muscles or both.  Tight hamstrings. SIGNS AND SYMPTOMS  Lumbosacral strain may cause pain in the area of your injury or pain that moves (radiates) down your leg.  DIAGNOSIS Your health care provider can often diagnose lumbosacral strain through a physical exam. In some cases, you may need tests such as X-ray exams.  TREATMENT  Treatment for your lower back injury depends on many factors that your clinician will have to evaluate. However, most treatment will include the use of anti-inflammatory medicines. HOME CARE INSTRUCTIONS   Avoid hard physical activities (tennis, racquetball, waterskiing) if you are not in proper physical condition for it. This may aggravate or create problems.  If you have a back problem, avoid sports requiring sudden body movements. Swimming and walking are generally safer activities.  Maintain good posture.  Maintain a healthy weight.  For acute conditions, you may put ice on the injured area.  Put ice in a plastic bag.  Place a towel between your skin and the bag.  Leave the ice on for 20 minutes, 2-3 times a day.  When the low back starts healing, stretching and strengthening exercises may be recommended. SEEK MEDICAL CARE IF:  Your back pain is getting worse.  You experience severe back pain not relieved with medicines. SEEK IMMEDIATE MEDICAL CARE IF:   You have numbness, tingling, weakness, or problems with the use of your arms or legs.  There is a change in bowel or bladder control.  You have increasing pain in any area of the body, including your belly (abdomen).  You notice shortness of breath, dizziness, or feel faint.  You feel sick to your stomach (nauseous), are throwing up (vomiting), or become sweaty.  You notice discoloration of your toes or legs, or your feet get very cold. MAKE SURE YOU:   Understand these instructions.  Will watch your condition.  Will get help right away if you are not  doing well or get worse. Document Released: 01/26/2005 Document Revised: 04/23/2013 Document Reviewed: 12/05/2012 [facility] Patient Information 2015 [place], [place]. This information is not intended to replace advice given to you by your health care provider. Make sure you discuss any questions you have with your health care provider.  Back Exercises Back exercises help treat and prevent back injuries. The goal of back exercises is to increase the strength of your abdominal and back muscles and the flexibility of your back. These exercises should be started when you no longer have back pain. Back exercises include:  Pelvic Tilt. Lie on your back with your knees bent. Tilt your pelvis until the lower part of your back is against the floor. Hold this position 5 to 10 sec and repeat 5 to 10 times.  Knee to Chest. Pull first 1 knee up against your chest and hold for 20 to 30 seconds, repeat this with the other knee, and then both knees. This may be done with the other leg straight or bent, whichever feels better.  Sit-Ups or Curl-Ups. Bend your knees 90 degrees. Start with tilting your pelvis, and do a partial, slow sit-up, lifting your trunk only 30 to 45 degrees off the floor. Take at least 2 to 3 seconds for each sit-up. Do not do sit-ups with your knees out straight. If partial sit-ups are difficult, simply do the above but with only tightening your  abdominal muscles and holding it as directed.  Hip-Lift. Lie on your back with your knees flexed 90 degrees. Push down with your feet and shoulders as you raise your hips a couple inches off the floor; hold for 10 seconds, repeat 5 to 10 times.  Back arches. Lie on your stomach, propping yourself up on bent elbows. Slowly press on your hands, causing an arch in your low back. Repeat 3 to 5 times. Any initial stiffness and discomfort should lessen with repetition over time.  Shoulder-Lifts. Lie face down with arms beside your body. Keep hips and torso  pressed to floor as you slowly lift your head and shoulders off the floor. Do not overdo your exercises, especially in the beginning. Exercises may cause you some mild back discomfort which lasts for a few minutes; however, if the pain is more severe, or lasts for more than 15 minutes, do not continue exercises until you see your caregiver. Improvement with exercise therapy for back problems is slow.  See your caregivers for assistance with developing a proper back exercise program. Document Released: 05/26/2004 Document Revised: 07/11/2011 Document Reviewed: 02/17/2011 [facility] Patient Information 2015 [place], [place]. This information is not intended to replace advice given to you by your health care provider. Make sure you discuss any questions you have with your health care provider.

## 2014-03-30 NOTE — ED Provider Notes (Signed)
CSN: [id number]     Arrival date & time 03/30/14  4098 History   First MD Initiated Contact with Patient 03/30/14 [phone]     Chief Complaint  Patient presents with  . Back Pain     (Consider location/radiation/quality/duration/timing/severity/associated sxs/prior Treatment) The history is provided by the patient. No language interpreter was used.  Gregory Bush is a 33 year old male with past medical history of migraines, hypothyroidism presenting to the emergency department with back pain that has been ongoing for the past 3 days. Patient reported on Thursday he was experiencing intense abdominal pain which then start to radiate towards his back. Patient reports the back pain is localized to lower portion the back described as stabbing sensation that is constant without radiation. Stated that he's been using Advil, tramadol, lidocaine patches without relief. 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 Stated that he is seen by Dr. Rennis Chris in [facility] orthopedics regarding chronic right shoulder pain and is due to have surgery performed soon-stated that he does get prescriptions of tramadol from Dr. Rennis Chris. Denied falls, injury, numbness, tingling, urinary bowel incontinence, loss of sensation, chest pain, shortness of breath, difficulty breathing, IV drug abuse. Denied history of back pain or kidney stones. PCP Dr. Mayford Knife  Past Medical History  Diagnosis Date  . Migraines   . Hypothyroid    History reviewed. No pertinent past surgical history. History reviewed. No pertinent family history. History  Substance Use Topics  . Smoking status: Never Smoker   . Smokeless tobacco: Not on file  . Alcohol Use: No    Review of Systems  Constitutional: Negative for fever and chills.  Respiratory: Negative for chest tightness and shortness of breath.   Cardiovascular: Negative for chest pain.    Gastrointestinal: Positive for nausea, abdominal pain and diarrhea. Negative for vomiting, constipation, blood in stool and anal bleeding.  Genitourinary: Negative for dysuria and decreased urine volume.  Musculoskeletal: Positive for back pain. Negative for neck pain.  Neurological: Negative for dizziness, weakness, numbness and headaches.      Allergies  Review of patient's allergies indicates no known allergies.  Home Medications   Prior to Admission medications   Medication Sig Start Date End Date Taking? Authorizing Provider  ciprofloxacin (CIPRO) 500 MG tablet Take 1 tablet (500 mg total) by mouth 2 (two) times daily. 03/30/14   Curlie Sittner, PA-C  levothyroxine (SYNTHROID, LEVOTHROID) 50 MCG tablet Take 50 mcg by mouth daily.      Historical Provider, MD  metroNIDAZOLE (FLAGYL) 500 MG tablet Take 1 tablet (500 mg total) by mouth 3 (three) times daily. 03/30/14   Sharissa Brierley, PA-C  ondansetron (ZOFRAN ODT) 4 MG disintegrating tablet 4mg  ODT q4 hours prn nausea/vomit 03/30/14   Nyrah Demos, PA-C  oxycodone (ROXICODONE) 30 MG immediate release tablet Take 30 mg by mouth every 4 (four) hours as needed. For pain     Historical Provider, MD  traMADol (ULTRAM) 50 MG tablet Take 1 tablet (50 mg total) by mouth every 12 (twelve) hours as needed for moderate pain. 01/04/14   Jess Barters H Presson, PA   BP 118/68 mmHg  Pulse 83  Temp(Src) 98.8 F (37.1 C) (Oral)  Resp 20  SpO2 100% Physical Exam  Constitutional: He is oriented to person, place, and time. He appears well-developed and well-nourished. No distress.  HENT:  Head: Normocephalic and atraumatic.  Eyes: Conjunctivae and EOM are  normal. Right eye exhibits no discharge. Left eye exhibits no discharge.  Neck: Normal range of motion. Neck supple. No tracheal deviation present.  Negative neck stiffness Negative nuchal rigidity Negative cervical lymphadenopathy Negative meningeal signs Negative pain upon palpation to  the C-spine  Cardiovascular: Normal rate, regular rhythm and normal heart sounds.   Pulses:      Radial pulses are 2+ on the right side, and 2+ on the left side.       Dorsalis pedis pulses are 2+ on the right side, and 2+ on the left side.  Pulmonary/Chest: Effort normal and breath sounds normal. No respiratory distress. He has no wheezes. He has no rales.  Abdominal: Soft. Bowel sounds are normal. He exhibits no distension. There is tenderness. There is no rebound and no guarding.  Negative abdominal distention Bowel sounds are active in all 4 quadrants Abdomen soft upon palpation Discomfort upon palpation to left upper quadrant and left lower quadrant of the abdomen Positive voluntary guarding identified Negative rebound tenderness Negative rigidity  Negative peritoneal signs  Musculoskeletal: Normal range of motion. He exhibits tenderness.       Lumbar back: He exhibits tenderness. He exhibits normal range of motion, no bony tenderness, no swelling, no edema, no deformity and no laceration.       Back:  Negative deformities, malalignments, erythema, bulging identified to the spine. Discomfort upon palpation to the mid spinal and paravertebral regions bilaterally. Appears to be muscular nature. Full ROM to upper and lower extremities without difficulty noted, negative ataxia noted.  Lymphadenopathy:    He has no cervical adenopathy.  Neurological: He is alert and oriented to person, place, and time. No cranial nerve deficit. He exhibits normal muscle tone. Coordination normal.  Cranial nerves III-XII grossly intact Strength 5+/5+ to upper and lower extremities bilaterally with resistance applied, equal distribution noted Equal grip strength bilaterally Sensation intact with differentiation to sharp and dull touch Negative arm drift Fine motor skills intact Heel to knee down shin normal bilaterally Gait proper, proper balance - negative sway, negative drift, negative step-offs    Skin: Skin is warm and dry. No rash noted. He is not diaphoretic. No erythema.  Psychiatric: He has a normal mood and affect. His behavior is normal. Thought content normal.  Nursing note and vitals reviewed.   ED Course  Procedures (including critical care time) Labs Review Labs Reviewed  URINALYSIS, ROUTINE W REFLEX MICROSCOPIC - Abnormal; Notable for the following:    Color, Urine AMBER (*)    Hgb urine dipstick MODERATE (*)    Bilirubin Urine MODERATE (*)    Ketones, ur 40 (*)    Protein, ur 30 (*)    Leukocytes, UA TRACE (*)    All other components within normal limits  CBC WITH DIFFERENTIAL - Abnormal; Notable for the following:    WBC 11.0 (*)    Neutrophils Relative % 86 (*)    Neutro Abs 9.4 (*)    Lymphocytes Relative 10 (*)    All other components within normal limits  COMPREHENSIVE METABOLIC PANEL - Abnormal; Notable for the following:    Glucose, Bld 117 (*)    Anion gap 16 (*)    All other components within normal limits  URINE RAPID DRUG SCREEN (HOSP PERFORMED) - Abnormal; Notable for the following:    Benzodiazepines POSITIVE (*)    Amphetamines POSITIVE (*)    All other components within normal limits  LIPASE, BLOOD  URINE MICROSCOPIC-ADD ON    Imaging Review Dg  Lumbar Spine Complete  03/30/2014   CLINICAL DATA:  Bilateral lower back pain at the level of L5.  EXAM: LUMBAR SPINE - COMPLETE 4+ VIEW  COMPARISON:  CT 03/21/2013  FINDINGS: Normal alignment of the lumbar spine. Vertebral body heights are maintained. Disc spaces are maintained. No evidence for an acute fracture.  IMPRESSION: No acute abnormality.   Electronically Signed   By: Richarda OverlieAdam  Henn M.D.   On: 03/30/2014 10:55   Ct Abdomen Pelvis W Contrast  03/30/2014   CLINICAL DATA:  33 year old with lower abdominal and back pain.  EXAM: CT ABDOMEN AND PELVIS WITH CONTRAST  TECHNIQUE: Multidetector CT imaging of the abdomen and pelvis was performed using the standard protocol following bolus administration  of intravenous contrast.  CONTRAST:  100 mL Omnipaque 300  COMPARISON:  03/21/2013  FINDINGS: Lung bases are clear.  Negative for free air.  Normal appearance of the liver, portal venous system and gallbladder. Normal appearance of the pancreas, spleen and adrenal glands. There is high-density material within the proximal left ureter which is not compatible with contrast. Small amount of high-density material within the left kidney upper pole collecting system without hydronephrosis. No suspicious renal lesions. Stable appearance of the right kidney and suspect a small cyst in the right mid pole. No significant free fluid or lymphadenopathy within the abdomen or pelvis.  Normal appearance of the prostate. There is high-density material within the anterior aspect of the bladder. No significant urinary bladder wall thickening or distention. The rectum is distended with stool. Appendix is mildly prominent for size, measuring up to 8 mm, but no surrounding inflammation. There is concern for mild wall thickening involving the right colon and transverse colon. However, these segments of colon are also decompressed. No evidence for pericolonic inflammation.  No acute bone abnormality.  IMPRESSION: There is high-density material within the left kidney upper pole collecting system, left ureter and urinary bladder. Findings are consistent with blood products. Source of the blood may be coming from the left kidney upper pole. No suspicious renal lesions.  Mild wall thickening of the right colon and transverse colon. These findings could be related to under distention versus mild colitis.   Electronically Signed   By: Richarda OverlieAdam  Henn M.D.   On: 03/30/2014 11:49     EKG Interpretation None       12:21 PM This provider discussed case in great detail with Dr Blinda LeatherwoodPollina - reviewed CT abdomen and pelvis with contrast regarding blood products in the left kidney. As per physician recommended urology consult secondary to possible  hemorrhage in the left kidney.  12:49 PM This provider spoke with Dr. Vernie Ammonsttelin, urologist on call here as per physician reported that he does not nicely know exactly what the CT abdomen and pelvis with contrast means. Reported that this does not appear to be blood since patient's urinalysis identified moderate hemoglobin with only 3-6 red blood cells noted on microscopic findings. As per physician, reported that patient does not need to be admitted. Patient can follow-up as outpatient in office.  MDM   Final diagnoses:  Colitis  Lumbar strain, initial encounter  Chronic pain    Medications  iohexol (OMNIPAQUE) 300 MG/ML solution 25 mL (25 mLs Oral Contrast Given 03/30/14 1018)  iohexol (OMNIPAQUE) 300 MG/ML solution 100 mL (not administered)  oxyCODONE-acetaminophen (PERCOCET/ROXICET) 5-325 MG per tablet 1 tablet (1 tablet Oral Given 03/30/14 1101)   Filed Vitals:   03/30/14 0823 03/30/14 1237  BP: 119/73 118/68  Pulse: 93 83  Temp: 97.8 F (36.6 C) 98.8 F (37.1 C)  TempSrc: Oral Oral  Resp: 18 20  SpO2: 100% 100%   CBC noted mildly elevated white blood cell count of 11.0. Hemoglobin 15.4, hematocrit 45.6. CMP unremarkable. Glucose 117, with elevated anion gap-16.0 mg/L. Lipase negative elevation. Urinalysis noted moderate bilirubin and moderate hemoglobin with trace of leukocytes. Urine drug screen positive for benzodiazepines and amphetamines. Plain film of lumbar spine negative for acute osseous injury. CT abdomen and pelvis with contrast noted a high density material within the left kidney pole collecting system, left ureter and urinary bladder-finding consistent with blood products source of blood products may be coming from left kidney upper pole-no suspicious renal lesions. Mild wall thickening of the right colon and transverse colon-these findings could be related to mild colitis. No signs of abscess or perforation.  This provider discussed case in great detail with on-call  urologist regarding findings on CT regarding possible hemorrhaging of the left kidney. As per physician reported that he has moderate hemoglobin with a red blood cell count of 3-6 in his urine microscopic and on which is not really convincing. Physician is unsure exactly what this finding is identified. Recommended that patient is to be followed up as an outpatient. Doubt AAA/dissection. Negative findings the lytic lesions. Doubt epidural abscess. Doubt cauda equina syndrome. Gallbladder unremarkable. Abdominal pain suspicious for colitis. Suspicion for back pain to be muscular secondary to pain upon palpation and pain with motion. Patient tolerated fluids by mouth without difficulty-negative episodes of emesis while in ED setting. Patient stable, afebrile. Patient not septic appearing. Discharged patient. Discharge patient with referral to PCP, orthopedics. Discussed with patient to rest and stay hydrated. Discharge patient with Cipro and Flagyl and Zofran for colitis. Discussed with patient proper diet. Discussed with patient to apply warm compressions and massage with icy hot ointment. Discussed with patient to closely monitor symptoms and if symptoms are to worsen or change to report back to the ED - strict return instructions given.  Patient agreed to plan of care, understood, all questions answered.   Raymon Mutton, PA-C 03/30/14 1318  Gilda Crease, MD 04/03/14 [phone]

## 2014-04-03 ENCOUNTER — Other Ambulatory Visit (INDEPENDENT_AMBULATORY_CARE_PROVIDER_SITE_OTHER): Payer: BC Managed Care – PPO

## 2014-04-03 ENCOUNTER — Ambulatory Visit (INDEPENDENT_AMBULATORY_CARE_PROVIDER_SITE_OTHER): Payer: BC Managed Care – PPO | Admitting: Gastroenterology

## 2014-04-03 ENCOUNTER — Encounter: Payer: Self-pay | Admitting: Gastroenterology

## 2014-04-03 VITALS — BP 104/62 | HR 80 | Ht 68.0 in | Wt 150.0 lb

## 2014-04-03 DIAGNOSIS — R197 Diarrhea, unspecified: Secondary | ICD-10-CM

## 2014-04-03 DIAGNOSIS — R9389 Abnormal findings on diagnostic imaging of other specified body structures: Secondary | ICD-10-CM

## 2014-04-03 DIAGNOSIS — R634 Abnormal weight loss: Secondary | ICD-10-CM

## 2014-04-03 DIAGNOSIS — R109 Unspecified abdominal pain: Secondary | ICD-10-CM

## 2014-04-03 DIAGNOSIS — R938 Abnormal findings on diagnostic imaging of other specified body structures: Secondary | ICD-10-CM

## 2014-04-03 LAB — IGA: IgA: 389 mg/dL — ABNORMAL HIGH (ref 68–378)

## 2014-04-03 MED ORDER — DICYCLOMINE HCL 10 MG PO CAPS: 10.0000 mg | ORAL_CAPSULE | Freq: Two times a day (BID) | ORAL | Status: DC

## 2014-04-03 MED ORDER — DICYCLOMINE HCL 20 MG PO TABS: 20.0000 mg | ORAL_TABLET | Freq: Two times a day (BID) | ORAL | Status: DC

## 2014-04-03 NOTE — Patient Instructions (Signed)
You have been scheduled for a colonoscopy. Please follow written instructions given to you at your visit today.  Please pick up your prep kit at the pharmacy within the next 1-3 days. If you use inhalers (even only as needed), please bring them with you on the day of your procedure. Your physician has requested that you go to www.startemmi.com and enter the access code given to you at your visit today. This web site gives a general overview about your procedure. However, you should still follow specific instructions given to you by our office regarding your preparation for the procedure.  Your physician has requested that you go to the basement for the following lab work before leaving today: IGA TTG STOOL O&P GI PATHOGEN PANEL  PANCREATIC FECAL ELASTASE  Please purchase Imodium over the counter and use as directed as needed for diarrhea   We have sent the following medications to your pharmacy for you to pick up at your convenience: Bentyl 20 mg, please take one tablet by mouth twice daily  Information on Lactose Diet is below for your review ________________________________________________________________________________________________________________________ Lactose Intolerance, Adult Lactose intolerance is when the body is not able to digest lactose, a sugar found in milk and milk products. Lactose intolerance is caused by your body not producing enough of the enzyme lactase. When there is not enough lactase to digest the amount of lactose consumed, discomfort may be felt. Lactose intolerance is not a milk allergy. For most people, lactase deficiency is a condition that develops naturally over time. After about the age of 2, the body begins to produce less lactase. But many people may not experience symptoms until they are much older. CAUSES Things that can cause you to be lactose intolerant include:  Aging.  Being born without the ability to make lactase.  Certain digestive  diseases.  Injuries to the small intestine. SYMPTOMS   Feeling sick to your stomach (nauseous).  Diarrhea.  Cramps.  Bloating.  Gas. Symptoms usually show up a half hour or 2 hours after eating or drinking products containing lactose. TREATMENT  No treatment can improve the body's ability to produce lactase. However, symptoms can be controlled through diet. A medicine may be given to you to take when you consume lactose-containing foods or drinks. The medicine contains the lactase enzyme, which help the body digest lactose better. HOME CARE INSTRUCTIONS  Eat or drink dairy products as told by your caregiver or dietician.  Take all medicine as directed by your caregiver.  Find lactose-free or lactose-reduced products at your local grocery store.  Talk to your caregiver or dietician to decide if you need any dietary supplements. The following is the amount of calcium needed from the diet:  19 to 50 years: 1000 mg  Over 50 years: 1200 mg Calcium and Lactose in Common Foods Non-Dairy Products / Calcium Content (mg)  Calcium-fortified orange juice, 1 cup / 308 to 344 mg  Sardines, with edible bones, 3 oz / 270 mg  Salmon, canned, with edible bones, 3 oz / 205 mg  Soymilk, fortified, 1 cup / 200 mg  Broccoli (raw), 1 cup / 90 mg  Orange, 1 medium / 50 mg  Pinto beans,  cup / 40 mg  Tuna, canned, 3 oz / 10 mg  Lettuce greens,  cup / 10 mg Dairy Products / Calcium Content (mg) / Lactose Content (g)  Yogurt, plain, low-fat, 1 cup / 415 mg / 5 g  Milk, reduced fat, 1 cup / 295 mg /  11 g  Swiss cheese, 1 oz / 270 mg / 1 g  Ice cream,  cup / 85 mg / 6 g  Cottage cheese,  cup / 75 mg / 2 to 3 g SEEK MEDICAL CARE IF: You have no relief from your symptoms. Document Released: 04/18/2005 Document Revised: 07/11/2011 Document Reviewed: 07/19/2013 [facility] Patient Information 2015 [place], [place]. This information is not intended to replace advice given to you by  your health care provider. Make sure you discuss any questions you have with your health care provider.

## 2014-04-04 ENCOUNTER — Other Ambulatory Visit: Payer: BC Managed Care – PPO

## 2014-04-04 DIAGNOSIS — R197 Diarrhea, unspecified: Secondary | ICD-10-CM

## 2014-04-04 DIAGNOSIS — R634 Abnormal weight loss: Secondary | ICD-10-CM

## 2014-04-04 DIAGNOSIS — R109 Unspecified abdominal pain: Secondary | ICD-10-CM

## 2014-04-04 DIAGNOSIS — R9389 Abnormal findings on diagnostic imaging of other specified body structures: Secondary | ICD-10-CM

## 2014-04-07 ENCOUNTER — Encounter: Payer: Self-pay | Admitting: Gastroenterology

## 2014-04-07 ENCOUNTER — Telehealth: Payer: Self-pay | Admitting: Internal Medicine

## 2014-04-07 DIAGNOSIS — R9389 Abnormal findings on diagnostic imaging of other specified body structures: Secondary | ICD-10-CM | POA: Insufficient documentation

## 2014-04-07 DIAGNOSIS — R634 Abnormal weight loss: Secondary | ICD-10-CM | POA: Insufficient documentation

## 2014-04-07 DIAGNOSIS — R109 Unspecified abdominal pain: Secondary | ICD-10-CM | POA: Insufficient documentation

## 2014-04-07 LAB — OVA AND PARASITE EXAMINATION: OP: NONE SEEN

## 2014-04-07 NOTE — Progress Notes (Signed)
04/07/2014 Gregory Bush [id number] March 31, 1981   HISTORY OF PRESENT ILLNESS:  This is a 33 year old male who is previously known to Dr. Jarold MottoPatterson for colonoscopy and EGD in 10/2009.  At that time colonoscopy was normal and EGD showed only esophagitis; small bowel biopsies were negative for celiac.  He has PMH of migraines, hypothyroidism, and anxiety.    He presents to our office today with his wife with complaints of diarrhea, lower abdominal pain, and weight loss.  Says that that diarrhea has been on and off for a while, but worsening over the past month.  Getting up at night with diarrhea, sometimes 4 times per night.Abdominal pain is in the lower abdomen.  Has lost approximately 40 pounds in the past 5 months unintentionally.  Complains of being very fatigued as well.  He went to the ED on 11/29 at which time CBC, CMP, and lipase were unchanged from previous and showed no acute issues.  CT scan of the abdomen and pelvis with contrast showed mild wall thickening of the right colon and transverse colon that could be related to under distention vs mild colitis.  He was given cipro and flagyl, which he has not taken yet because antibiotics cause him a lot of side effects.  He denies seeing blood in his stool, but says there is some mucus at times.  Recent TSH is ok.    Past Medical History  Diagnosis Date  . Migraines   . Hypothyroidism   . Anxiety    Past Surgical History  Procedure Laterality Date  . Eye muscle surgery Bilateral 1985    reports that he has never smoked. He has never used smokeless tobacco. He reports that he does not drink alcohol or use illicit drugs. family history includes Breast cancer in his mother; Diabetes in his mother; Heart disease in his mother; Heart failure in his maternal grandmother; Pancreatic cancer in his mother; Prostate cancer in his father. No Known Allergies    Outpatient Encounter Prescriptions as of 04/03/2014  Medication Sig  .  levothyroxine (SYNTHROID, LEVOTHROID) 50 MCG tablet Take 50 mcg by mouth daily.    Marland Kitchen. dicyclomine (BENTYL) 20 MG tablet Take 1 tablet (20 mg total) by mouth 2 (two) times daily.  . [DISCONTINUED] ciprofloxacin (CIPRO) 500 MG tablet Take 1 tablet (500 mg total) by mouth 2 (two) times daily.  . [DISCONTINUED] dicyclomine (BENTYL) 10 MG capsule Take 1 capsule (10 mg total) by mouth 2 (two) times daily.  . [DISCONTINUED] metroNIDAZOLE (FLAGYL) 500 MG tablet Take 1 tablet (500 mg total) by mouth 3 (three) times daily.  . [DISCONTINUED] ondansetron (ZOFRAN ODT) 4 MG disintegrating tablet 4mg  ODT q4 hours prn nausea/vomit  . [DISCONTINUED] oxycodone (ROXICODONE) 30 MG immediate release tablet Take 30 mg by mouth every 4 (four) hours as needed. For pain   . [DISCONTINUED] traMADol (ULTRAM) 50 MG tablet Take 1 tablet (50 mg total) by mouth every 12 (twelve) hours as needed for moderate pain.     REVIEW OF SYSTEMS  : All other systems reviewed and negative except where noted in the History of Present Illness.   PHYSICAL EXAM: BP 104/62 mmHg  Pulse 80  Ht 5\' 8"  (1.727 m)  Wt 150 lb (68.04 kg)  BMI 22.81 kg/m2 General: Well developed white male in no acute distress Head: Normocephalic and atraumatic Eyes:  Sclerae anicteric, conjunctiva pink. Ears: Normal auditory acuity Lungs: Clear throughout to auscultation Heart: Regular rate and rhythm Abdomen: Soft, non-distended.  Normal bowel sounds.  Mild diffuse TTP. Rectal:  Will be done at the time of colonoscopy. Musculoskeletal: Symmetrical with no gross deformities  Skin: No lesions on visible extremities Extremities: No edema  Neurological: Alert oriented x 4, grossly non-focal Psychological:  Alert and cooperative. Normal mood and affect  ASSESSMENT AND PLAN: -33 year old male with complaints of diarrhea, abdominal pain, 40 pound weight loss, and CT scan showing possible mild thickening in the right and transverse colon.  Rule out infectious  source vs IBD, etc.  Will check stool GI pathogen panel and O & P.  Will wait to take antibiotics for now.  will also check pancreatic elastase for pancreatic insufficiency, however, this would not have an abnormal appearance of the colon on CT scan.  Will check labs from celiac disease.  Will schedule colonoscopy for evaluation since this would be the next step if stool studies are negative.  The risks, benefits, and alternatives were discussed with the patient and he consents to proceed.  If stool studies negative then ok to use Imodium prn and will try Bentyl 20 mg BID.  Will also try lactose intolerance diet; instructions given (although this should not cause weight loss or CT scan changes).

## 2014-04-07 NOTE — Telephone Encounter (Signed)
Patient notified that the labs are all still pending. We will call when the results are available.

## 2014-04-07 NOTE — Progress Notes (Signed)
Agree with Ms. Zehr's management.  Deatrice Spanbauer E. Tekeya Geffert, MD, FACG  

## 2014-04-08 ENCOUNTER — Telehealth: Payer: Self-pay | Admitting: *Deleted

## 2014-04-08 LAB — GASTROINTESTINAL PATHOGEN PANEL PCR

## 2014-04-08 LAB — TISSUE TRANSGLUTAMINASE, IGA: Tissue Transglutaminase Ab, IgA: 1 U/mL (ref <4)

## 2014-04-08 NOTE — Telephone Encounter (Signed)
[facility] called they did not have enough stool to do the GI Pathogen Panel other stool test are being processed. I called lab downstairs to advise them that patient will be picking up stool cup because of [facility] request. Patient notified as well, patient verbalized understanding.

## 2014-04-09 ENCOUNTER — Other Ambulatory Visit: Payer: Self-pay | Admitting: *Deleted

## 2014-04-09 DIAGNOSIS — R197 Diarrhea, unspecified: Secondary | ICD-10-CM

## 2014-04-10 ENCOUNTER — Encounter: Payer: Self-pay | Admitting: Internal Medicine

## 2014-04-10 ENCOUNTER — Ambulatory Visit (AMBULATORY_SURGERY_CENTER): Payer: BC Managed Care – PPO | Admitting: Internal Medicine

## 2014-04-10 VITALS — BP 95/64 | HR 59 | Temp 97.8°F | Resp 17 | Ht 68.0 in | Wt 150.0 lb

## 2014-04-10 DIAGNOSIS — R197 Diarrhea, unspecified: Secondary | ICD-10-CM

## 2014-04-10 MED ORDER — SODIUM CHLORIDE 0.9 % IV SOLN: 500.0000 mL | INTRAVENOUS | Status: DC

## 2014-04-10 NOTE — Progress Notes (Signed)
Called to room to assist during endoscopic procedure.  Patient ID and intended procedure confirmed with present staff. Received instructions for my participation in the procedure from the performing physician.  

## 2014-04-10 NOTE — Patient Instructions (Addendum)
I did not see any problems but took biopsies and will let you know.  Hang in there.  I appreciate the opportunity to care for you. Iva Booparl E. Gessner, MD, FACG  YOU HAD AN ENDOSCOPIC PROCEDURE TODAY AT THE [facility]: Refer to the procedure report that was given to you for any specific questions about what was found during the examination.  If the procedure report does not answer your questions, please call your gastroenterologist to clarify.  If you requested that your care partner not be given the details of your procedure findings, then the procedure report has been included in a sealed envelope for you to review at your convenience later.  YOU SHOULD EXPECT: Some feelings of bloating in the abdomen. Passage of more gas than usual.  Walking can help get rid of the air that was put into your GI tract during the procedure and reduce the bloating. If you had a lower endoscopy (such as a colonoscopy or flexible sigmoidoscopy) you may notice spotting of blood in your stool or on the toilet paper. If you underwent a bowel prep for your procedure, then you may not have a normal bowel movement for a few days.  DIET: Your first meal following the procedure should be a light meal and then it is ok to progress to your normal diet.  A half-sandwich or bowl of soup is an example of a good first meal.  Heavy or fried foods are harder to digest and may make you feel nauseous or bloated.  Likewise meals heavy in dairy and vegetables can cause extra gas to form and this can also increase the bloating.  Drink plenty of fluids but you should avoid alcoholic beverages for 24 hours.  ACTIVITY: Your care partner should take you home directly after the procedure.  You should plan to take it easy, moving slowly for the rest of the day.  You can resume normal activity the day after the procedure however you should NOT DRIVE or use heavy machinery for 24 hours (because of the sedation medicines used during  the test).    SYMPTOMS TO REPORT IMMEDIATELY: A gastroenterologist can be reached at any hour.  During normal business hours, 8:30 AM to 5:00 PM Monday through Friday, call [phone].  After hours and on weekends, please call the GI answering service at [phone] who will take a message and have the physician on call contact you.   Following lower endoscopy (colonoscopy or flexible sigmoidoscopy):  Excessive amounts of blood in the stool  Significant tenderness or worsening of abdominal pains  Swelling of the abdomen that is new, acute  Fever of 100F or higher  Following upper endoscopy (EGD)  Vomiting of blood or coffee ground material  New chest pain or pain under the shoulder blades  Painful or persistently difficult swallowing  New shortness of breath  Fever of 100F or higher  Black, tarry-looking stools  FOLLOW UP: If any biopsies were taken you will be contacted by phone or by letter within the next 1-3 weeks.  Call your gastroenterologist if you have not heard about the biopsies in 3 weeks.  Our staff will call the home number listed on your records the next business day following your procedure to check on you and address any questions or concerns that you may have at that time regarding the information given to you following your procedure. This is a courtesy call and so if there is no answer at the home  number and we have not heard from you through the emergency physician on call, we will assume that you have returned to your regular daily activities without incident.  SIGNATURES/CONFIDENTIALITY: You and/or your care partner have signed paperwork which will be entered into your electronic medical record.  These signatures attest to the fact that that the information above on your After Visit Summary has been reviewed and is understood.  Full responsibility of the confidentiality of this discharge information lies with you and/or your care-partner.YOU HAD AN ENDOSCOPIC  PROCEDURE TODAY AT THE [facility]: Refer to the procedure report that was given to you for any specific questions about what was found during the examination.  If the procedure report does not answer your questions, please call your gastroenterologist to clarify.  If you requested that your care partner not be given the details of your procedure findings, then the procedure report has been included in a sealed envelope for you to review at your convenience later.  YOU SHOULD EXPECT: Some feelings of bloating in the abdomen. Passage of more gas than usual.  Walking can help get rid of the air that was put into your GI tract during the procedure and reduce the bloating. If you had a lower endoscopy (such as a colonoscopy or flexible sigmoidoscopy) you may notice spotting of blood in your stool or on the toilet paper. If you underwent a bowel prep for your procedure, then you may not have a normal bowel movement for a few days.  DIET: Your first meal following the procedure should be a light meal and then it is ok to progress to your normal diet.  A half-sandwich or bowl of soup is an example of a good first meal.  Heavy or fried foods are harder to digest and may make you feel nauseous or bloated.  Likewise meals heavy in dairy and vegetables can cause extra gas to form and this can also increase the bloating.  Drink plenty of fluids but you should avoid alcoholic beverages for 24 hours.  ACTIVITY: Your care partner should take you home directly after the procedure.  You should plan to take it easy, moving slowly for the rest of the day.  You can resume normal activity the day after the procedure however you should NOT DRIVE or use heavy machinery for 24 hours (because of the sedation medicines used during the test).    SYMPTOMS TO REPORT IMMEDIATELY: A gastroenterologist can be reached at any hour.  During normal business hours, 8:30 AM to 5:00 PM Monday through Friday, call [phone].   After hours and on weekends, please call the GI answering service at [phone] who will take a message and have the physician on call contact you.   Following lower endoscopy (colonoscopy or flexible sigmoidoscopy):  Excessive amounts of blood in the stool  Significant tenderness or worsening of abdominal pains  Swelling of the abdomen that is new, acute  Fever of 100F or higher  FOLLOW UP: If any biopsies were taken you will be contacted by phone or by letter within the next 1-3 weeks.  Call your gastroenterologist if you have not heard about the biopsies in 3 weeks.  Our staff will call the home number listed on your records the next business day following your procedure to check on you and address any questions or concerns that you may have at that time regarding the information given to you following your procedure. This is a courtesy call and so  if there is no answer at the home number and we have not heard from you through the emergency physician on call, we will assume that you have returned to your regular daily activities without incident.  SIGNATURES/CONFIDENTIALITY: You and/or your care partner have signed paperwork which will be entered into your electronic medical record.  These signatures attest to the fact that that the information above on your After Visit Summary has been reviewed and is understood.  Full responsibility of the confidentiality of this discharge information lies with you and/or your care-partner.

## 2014-04-10 NOTE — Op Note (Signed)
[facility] [address] [place], [id number]   COLONOSCOPY PROCEDURE REPORT  PATIENT: [name], Gregory  MR#: [id number] BIRTHDATE: 08-13-1980 , 33  yrs. old GENDER: male ENDOSCOPIST: Iva Booparl E Jenavive Lamboy, MD, [facility] PROCEDURE DATE:  04/10/2014 PROCEDURE:   Colonoscopy with biopsy First Screening Colonoscopy - Avg.  risk and is 50 yrs.  old or older - No.  Prior Negative Screening - Now for repeat screening. N/A  History of Adenoma - Now for follow-up colonoscopy & has been > or = to 3 yrs.  N/A  Polyps Removed Today? No.  Polyps Removed Today? No.  Recommend repeat exam, <10 yrs? Polyps Removed Today? No.  Recommend repeat exam, <10 yrs? No. ASA CLASS:   Class II INDICATIONS:unexplained diarrhea. MEDICATIONS: Propofol 200 mg IV and Monitored anesthesia care  DESCRIPTION OF PROCEDURE:   After the risks benefits and alternatives of the procedure were thoroughly explained, informed consent was obtained.  The digital rectal exam revealed no abnormalities of the rectum, revealed the prostate was not enlarged, and revealed no prostatic nodules.   The LB [id number]  endoscope was introduced through the anus and advanced to the terminal ileum which was intubated for a short distance. No adverse events experienced.   The quality of the prep was excellent.  The instrument was then slowly withdrawn as the colon was fully examined.      COLON FINDINGS: The examined terminal ileum appeared to be normal. Multiple random biopsies were performed using cold forceps.  Sample was obtained and sent to histology.   The colonic mucosa appeared normal throughout the entire examined colon.  Multiple random biopsies were performed using cold forceps.  Retroflexed views revealed no abnormalities. The time to cecum=1 minutes 56 seconds. Withdrawal time=7 minutes 53 seconds.  The scope was withdrawn and the procedure completed. COMPLICATIONS: There were no immediate  complications.  ENDOSCOPIC IMPRESSION: 1.   The examined terminal ileum appeared to be normal.; multiple random biopsies were performed using cold forceps 2.   The colonic mucosa appeared normal throughout the entire examined colon; multiple random biopsies were performed using cold forceps  RECOMMENDATIONS: Await pathology results  eSigned:  Iva Booparl E Zariyah Stephens, MD, [facility] 04/10/2014 8:38 AM   cc: The Patient and Lerry Linerwight Williams, MD

## 2014-04-10 NOTE — Progress Notes (Signed)
Report to PACU, RN, vss, BBS= Clear.  

## 2014-04-11 ENCOUNTER — Telehealth: Payer: Self-pay | Admitting: *Deleted

## 2014-04-11 LAB — PANCREATIC ELASTASE, FECAL

## 2014-04-11 NOTE — Telephone Encounter (Signed)
  Follow up Call- called [phone]  No flowsheet data found.   LMOM

## 2014-04-17 NOTE — Progress Notes (Signed)
Quick Note:  Please call him and let him know that colon biopsies all ok Could be getting too much Synthroid Have him check a TSH and Free T4 please  [facility] no letter or recall  ______

## 2014-04-18 ENCOUNTER — Other Ambulatory Visit: Payer: Self-pay

## 2014-04-18 DIAGNOSIS — R197 Diarrhea, unspecified: Secondary | ICD-10-CM

## 2014-04-28 ENCOUNTER — Other Ambulatory Visit (INDEPENDENT_AMBULATORY_CARE_PROVIDER_SITE_OTHER): Payer: BC Managed Care – PPO

## 2014-04-28 DIAGNOSIS — R197 Diarrhea, unspecified: Secondary | ICD-10-CM

## 2014-04-28 LAB — TSH: TSH: 0.43 u[IU]/mL (ref 0.35–4.50)

## 2014-04-28 LAB — T4, FREE: Free T4: 1.07 ng/dL (ref 0.60–1.60)

## 2014-04-29 ENCOUNTER — Telehealth: Payer: Self-pay | Admitting: Internal Medicine

## 2014-04-29 NOTE — Telephone Encounter (Signed)
Left a message for patient to call back. 

## 2014-04-29 NOTE — Telephone Encounter (Signed)
Can cancel the labs as had in Nov  Ask him why drug screen was + for amphetamines in November ED visit?  Do not see this on med list ??  No Rx until we get an answer for that

## 2014-04-29 NOTE — Telephone Encounter (Signed)
Let him know thyroid studies ok  At this point looks like he has IBS and anxiety may be underlying that.  1) Xifaxan 550 mg tid x 14 days for IBS-D 2) I see he has been Rxed Xanax and their is a hx of anxiety - is he seeing someone about this? 3) Schedule f/u me in Jan 4) Ask him to come to our office and be weighed, also get CMET and CBC w/ diff re: diarrhea

## 2014-04-29 NOTE — Progress Notes (Signed)
Quick Note:  Patient informed - see phone note ______

## 2014-04-29 NOTE — Telephone Encounter (Signed)
Patient calling about TSH, T4 levels(normal). He states he is still having symptoms. Please, advise.

## 2014-05-05 NOTE — Telephone Encounter (Signed)
Left message for patient to call back Dr. Leone Payor please clarify, no xifaxan rx or are you saying no xanax until answer to amphetamine question?

## 2014-05-06 NOTE — Telephone Encounter (Signed)
Attempted to return his call.  Mailbox is full. I will try again tomorrow

## 2014-05-06 NOTE — Telephone Encounter (Signed)
Yes - if he is using amphetamines could be causing sxs

## 2014-05-07 MED ORDER — RIFAXIMIN 550 MG PO TABS: 550.0000 mg | ORAL_TABLET | Freq: Three times a day (TID) | ORAL | Status: DC

## 2014-05-07 NOTE — Telephone Encounter (Signed)
OK try Xifaxan as previously written and we need to see him later this month

## 2014-05-07 NOTE — Telephone Encounter (Signed)
Patient notified Office visit scheduled for 2/23

## 2014-05-07 NOTE — Telephone Encounter (Signed)
Patient reports that the amphetamines were a  False positive.  He reports that he went that same day for a drug screen and it was negative.  He reports that he can provide a copy of the results.  He says that he was told that the benadryl he took the night before was possible responsible for it.  His med list is up to date and reports no ADD meds etc.  Please advise

## 2014-06-24 ENCOUNTER — Ambulatory Visit: Payer: Self-pay | Admitting: Internal Medicine

## 2014-06-27 ENCOUNTER — Encounter: Payer: Self-pay | Admitting: Internal Medicine

## 2014-06-27 NOTE — Progress Notes (Signed)
Patient ID: Gregory Bush, male   DOB: [birth year], 34 y.o.   MRN: [id number] The patient's chart has been reviewed by Dr. Leone PayorGessner  and the recommendations are noted below.    Follow-up advised. Schedule patient for next available appointment. Outcome of communication with the patient: letter mailed

## 2014-09-26 ENCOUNTER — Encounter (HOSPITAL_COMMUNITY): Payer: Self-pay | Admitting: Emergency Medicine

## 2014-09-26 ENCOUNTER — Emergency Department (INDEPENDENT_AMBULATORY_CARE_PROVIDER_SITE_OTHER)
Admission: EM | Admit: 2014-09-26 | Discharge: 2014-09-26 | Disposition: A | Discharge status: Home / Self Care | Payer: BC Managed Care – PPO | Source: Home / Self Care | Attending: Family Medicine | Admitting: Family Medicine

## 2014-09-26 DIAGNOSIS — M25511 Pain in right shoulder: Secondary | ICD-10-CM

## 2014-09-26 DIAGNOSIS — M7581 Other shoulder lesions, right shoulder: Secondary | ICD-10-CM

## 2014-09-26 MED ORDER — AMMONIA AROMATIC IN INHA
RESPIRATORY_TRACT | Status: AC
  Filled 2014-09-26: qty 10

## 2014-09-26 MED ORDER — METHYLPREDNISOLONE ACETATE 80 MG/ML IJ SUSP
INTRAMUSCULAR | Status: AC
  Filled 2014-09-26: qty 1

## 2014-09-26 MED ORDER — HYDROCODONE-ACETAMINOPHEN 5-325 MG PO TABS: 1.0000 | ORAL_TABLET | Freq: Four times a day (QID) | ORAL | Status: DC | PRN

## 2014-09-26 MED ORDER — DICLOFENAC SODIUM 75 MG PO TBEC: 75.0000 mg | DELAYED_RELEASE_TABLET | Freq: Two times a day (BID) | ORAL | Status: DC

## 2014-09-26 NOTE — Discharge Instructions (Signed)
Rotator Cuff Tendinitis  °Rotator cuff tendinitis is inflammation of the tough, cord-like bands that connect muscle to bone (tendons) in your rotator cuff. Your rotator cuff is the collection of all the muscles and tendons that connect your arm to your shoulder. Your rotator cuff holds the head of your upper arm bone (humerus) in the cup (fossa) of your shoulder blade (scapula). °CAUSES °Rotator cuff tendinitis is usually caused by overusing the joint involved.  °SIGNS AND SYMPTOMS °· Deep ache in the shoulder also felt on the outside upper arm over the shoulder muscle. °· Point tenderness over the area that is injured. °· Pain comes on gradually and becomes worse with lifting the arm to the side (abduction) or turning it inward (internal rotation). °· May lead to a chronic tear: When a rotator cuff tendon becomes inflamed, it runs the risk of losing its blood supply, causing some tendon fibers to die. This increases the risk that the tendon can fray and partially or completely tear. °DIAGNOSIS °Rotator cuff tendinitis is diagnosed by taking a medical history, performing a physical exam, and reviewing results of imaging exams. The medical history is useful to help determine the type of rotator cuff injury. The physical exam will include looking at the injured shoulder, feeling the injured area, and watching you do range-of-motion exercises. X-ray exams are typically done to rule out other causes of shoulder pain, such as fractures. MRI is the imaging exam usually used for significant shoulder injuries. Sometimes a dye study called CT arthrogram is done, but it is not as widely used as MRI. In some institutions, special ultrasound tests may also be used to aid in the diagnosis. °TREATMENT  °Less Severe Cases °· Use of a sling to rest the shoulder for a short period of time. Prolonged use of the sling can cause stiffness, weakness, and loss of motion of the shoulder joint. °· Anti-inflammatory medicines, such as  ibuprofen or naproxen sodium, may be prescribed. °More Severe Cases °· Physical therapy. °· Use of steroid injections into the shoulder joint. °· Surgery. °HOME CARE INSTRUCTIONS  °· Use a sling or splint until the pain decreases. Prolonged use of the sling can cause stiffness, weakness, and loss of motion of the shoulder joint. °· Apply ice to the injured area: °¨ Put ice in a plastic bag. °¨ Place a towel between your skin and the bag. °¨ Leave the ice on for 20 minutes, 2-3 times a day. °· Try to avoid use other than gentle range of motion while your shoulder is painful. Use the shoulder and exercise only as directed by your health care provider. Stop exercises or range of motion if pain or discomfort increases, unless directed otherwise by your health care provider. °· Only take over-the-counter or prescription medicines for pain, discomfort, or fever as directed by your health care provider. °· If you were given a shoulder sling and straps (immobilizer), do not remove it except as directed, or until you see a health care provider for a follow-up exam. If you need to remove it, move your arm as little as possible or as directed. °· You may want to sleep on several pillows at night to lessen swelling and pain. °SEEK IMMEDIATE MEDICAL CARE IF:  °· Your shoulder pain increases or new pain develops in your arm, hand, or fingers and is not relieved with medicines. °· You have new, unexplained symptoms, especially increased numbness in the hands or loss of strength. °· You develop any worsening of the problems   that brought you in for care.  Your arm, hand, or fingers are numb or tingling.  Your arm, hand, or fingers are swollen, painful, or turn white or blue. MAKE SURE YOU:  Understand these instructions.  Will watch your condition.  Will get help right away if you are not doing well or get worse. Document Released: 07/09/2003 Document Revised: 02/06/2013 Document Reviewed: 11/28/2012 [facility] Patient  Information 2015 [employer], [place]. This information is not intended to replace advice given to you by your health care provider. Make sure you discuss any questions you have with your health care provider.    Ice to right shoulder every 1-2 hours for 20 minutes at a time. After 24 hours may start the wall walks and pendulum exercises we discussed. Take Diclofenac every day twice a day and use the Hydrocodone for severe pain. Keep f/u with ORtho.

## 2014-09-26 NOTE — ED Notes (Signed)
C/o right shoulder pain onset 3 months; last 2-3 days getting worse Denies inj/trauma; pain increases w/activity Alert, no signs of acute distress.

## 2014-09-26 NOTE — ED Provider Notes (Signed)
CSN: [id number]     Arrival date & time 09/26/14  1339 History   First MD Initiated Contact with Patient 09/26/14 1502     Chief Complaint  Patient presents with  . Shoulder Pain   (Consider location/radiation/quality/duration/timing/severity/associated sxs/prior Treatment) HPI Comments: Patient presents with right shoulder pain. Onset 3 months without a known injury. He plays hockey and questions if the pain started with that; but does not recall a specific injury. Over the last 3 days his pain has worsened with decreased ROM and difficulty sleeping. At times radiates to right upper arm. No neck pain, numbness, tingling or weakness is noted.   Patient is a 34 y.o. male presenting with shoulder pain. The history is provided by the patient.  Shoulder Pain   Past Medical History  Diagnosis Date  . Migraines   . Hypothyroidism   . Anxiety   . IBS (irritable bowel syndrome)    Past Surgical History  Procedure Laterality Date  . Eye muscle surgery Bilateral 1985  . Colonoscopy w/ biopsies    . Esophagogastroduodenoscopy     Family History  Problem Relation Age of Onset  . Breast cancer Mother   . Pancreatic cancer Mother   . Diabetes Mother   . Prostate cancer Father   . Heart disease Mother   . Heart failure Maternal Grandmother    History  Substance Use Topics  . Smoking status: Never Smoker   . Smokeless tobacco: Never Used  . Alcohol Use: No    Review of Systems  All other systems reviewed and are negative.   Allergies  Review of patient's allergies indicates no known allergies.  Home Medications   Prior to Admission medications   Medication Sig Start Date End Date Taking? Authorizing Provider  dicyclomine (BENTYL) 20 MG tablet Take 1 tablet (20 mg total) by mouth 2 (two) times daily. 04/03/14  Yes Jessica D Zehr, PA-C  levothyroxine (SYNTHROID, LEVOTHROID) 50 MCG tablet Take 50 mcg by mouth daily.     Yes Historical Provider, MD  rifaximin (XIFAXAN) 550 MG TABS  tablet Take 1 tablet (550 mg total) by mouth 3 (three) times daily. 05/07/14  Yes Iva Boop, MD  diclofenac (VOLTAREN) 75 MG EC tablet Take 1 tablet (75 mg total) by mouth 2 (two) times daily. 09/26/14   Riki Sheer, PA-C  HYDROcodone-acetaminophen (NORCO/VICODIN) 5-325 MG per tablet Take 1-2 tablets by mouth every 6 (six) hours as needed for moderate pain. 09/26/14   Riki Sheer, PA-C   BP 119/74 mmHg  Pulse 85  Temp(Src) 97 F (36.1 C) (Oral)  Resp 18  SpO2 99% Physical Exam  Constitutional: He is oriented to person, place, and time. He appears well-developed and well-nourished. No distress.  Musculoskeletal:  Decreased and painful passive ROM of the right shoulder to flexion, abduction and IR. Positive impingement sign. Increased active ROM. No effusions or tenderness with palpation.    Neurological: He is alert and oriented to person, place, and time.  Skin: Skin is warm and dry. He is not diaphoretic.  Psychiatric: His behavior is normal.  Nursing note and vitals reviewed.   ED Course  Injection of joint Date/Time: 09/26/2014 3:45 PM Performed by: Riki Sheer Authorized by: Bradd Canary D Consent: Verbal consent obtained. Risks and benefits: risks, benefits and alternatives were discussed Consent given by: patient Patient identity confirmed: verbally with patient Local anesthesia used: yes Local anesthetic: topical anesthetic Anesthetic total: 2 ml Patient sedated: no Patient tolerance: Patient tolerated the  procedure well with no immediate complications Comments: A subacromial injection performed to right shoulder without difficulty.    (including critical care time) Labs Review Labs Reviewed - No data to display  Imaging Review No results found.   MDM   1. Rotator cuff tendonitis, right   2. Shoulder pain, acute, right    1. Ongoing with failure to respond to NSAIDs, Icing and rest over the last few weeks. Exam suggest a RTC tendonitis without  weakness to suggest a RTCT. Elected to perform a subacromial shoulder injection. Used 80mg  (depomedrol) see procedure note. Instructed to ice and start gentle ROM exercises in 24 hours. Keep f/u with Ortho as scheduled.     Riki SheerMichelle G Lyndell Gillyard, PA-C 09/26/14 1609

## 2014-09-28 ENCOUNTER — Ambulatory Visit (INDEPENDENT_AMBULATORY_CARE_PROVIDER_SITE_OTHER): Payer: BC Managed Care – PPO | Admitting: Physician Assistant

## 2014-09-28 ENCOUNTER — Emergency Department (HOSPITAL_COMMUNITY): Admission: EM | Admit: 2014-09-28 | Discharge: 2014-09-28 | Discharge status: Absent without leave | Payer: Self-pay

## 2014-09-28 VITALS — BP 130/76 | HR 79 | Temp 98.7°F | Resp 16 | Ht 68.0 in | Wt 160.1 lb

## 2014-09-28 DIAGNOSIS — M25511 Pain in right shoulder: Secondary | ICD-10-CM | POA: Diagnosis not present

## 2014-09-28 MED ORDER — HYDROCODONE-ACETAMINOPHEN 5-325 MG PO TABS: 1.0000 | ORAL_TABLET | Freq: Four times a day (QID) | ORAL | Status: DC | PRN

## 2014-09-28 NOTE — Progress Notes (Signed)
   Subjective:    Patient ID: Gregory Bush, male    DOB: 10/23/1980, 34 y.o.   MRN: [id number]  Chief Complaint  Patient presents with  . Shoulder Pain    Right-Radiates up to neck   Patient Active Problem List   Diagnosis Date Noted  . AP (abdominal pain) 04/07/2014  . Abnormal CT scan 04/07/2014  . Loss of weight 04/07/2014  . ACUTE GASTRITIS WITHOUT MENTION OF HEMORRHAGE 11/06/2009  . DIARRHEA 10/30/2009  . UNSPECIFIED HYPOTHYROIDISM 10/29/2009  . HYPERCHOLESTEROLEMIA 10/29/2009  . ANXIETY 10/29/2009  . MICROSCOPIC HEMATURIA 10/29/2009   Medications, allergies, past medical history, surgical history, family history, social history and problem list reviewed and updated.  HPI  89 yom presents with ongoing right shoulder pain.   Intermittent for several yrs. 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   [facility] and diagnosed with rotator cuff tendonitis. Received depomedrol shoulder injx, given norco, diclofenac. He has a previously scheduled appt with ortho for this Fri 10/03/14.   He presents today wanting pain medication to get him to his ortho appt. States [facility] but found out his copay was significantly less here so came here. [facility], brings them with him, has 4 left in bottle. States he wants enough to get him to his Fri appt. He is taking the voltaren bid. Has also been taking advil daily. Has been icing.   Review of Systems No fevers, chills.     Objective:   Physical Exam  Constitutional: He is oriented to person, place, and time. He appears well-developed and well-nourished.  Non-toxic appearance. He does not have a sickly appearance. He does not appear ill. No distress.  BP 130/76 mmHg  Pulse 79  Temp(Src) 98.7 F (37.1 C) (Oral)  Resp 16  Ht 5\' 8"  (1.727 m)  Wt 160 lb 2 oz (72.632 kg)  BMI 24.35 kg/m2  SpO2 98%   Neck: Normal range of motion  and full passive range of motion without pain. No spinous process tenderness and no muscular tenderness present.  Musculoskeletal:       Right shoulder: He exhibits decreased range of motion, tenderness and spasm.       Left shoulder: Normal.       Right elbow: Normal. TTP anterior right shoulder. Unable to lift arm overhead due to pain. Spasm noted along with medial scapular border. Unable to attempt neers test. Positive pain with resisted external rotation. Normal sensation.   Neurological: He is alert and oriented to person, place, and time.  Psychiatric: He has a normal mood and affect. His speech is normal and behavior is normal.      Assessment & Plan:   33 yom presents with ongoing right shoulder pain.   Right shoulder pain - Plan: HYDROcodone-acetaminophen (NORCO/VICODIN) 5-325 MG per tablet --#20 norco given with no refills, this will get him to his Fri appt --pt instructed further pain medication will have to come from ortho for this condition, he is agreeable --continue bid voltaren, dc other nsaids while taking --he has flexeril at home, instructed this may help with scapular spasm but to use caution not to take along with norco as can be overly sedating --pt to see ortho this Tonye PearsonFri  Tawna Alwin M. Starling Jessie, PA-C Physician Assistant-Certified [facility]  09/29/2014 9:52 PM

## 2014-09-28 NOTE — Patient Instructions (Signed)
I've refilled your vicodin for 20 more pills to get you to your ortho appointment on Friday.  No refills for this through us until you see ortho.  Continue to take the voltaren twice daily, no advil while taking the voltaren. You can take tylenol as needed. You may want to take the muscle relaxant as needed for the spasm.

## 2014-09-29 ENCOUNTER — Encounter: Payer: Self-pay | Admitting: Physician Assistant

## 2014-09-30 ENCOUNTER — Telehealth: Payer: Self-pay

## 2014-09-30 NOTE — Telephone Encounter (Signed)
Pt stopped in and stated that he needs more hydrocodone. He has to take 2 every 6 hours and is about out. He needs enough till Friday when he goes to the ortho dr, He can be reached @845 [phone]. Thank you

## 2014-09-30 NOTE — Telephone Encounter (Signed)
I will not be filling any more for him until he sees ortho. I established this with him when I saw him over the weekend. I gave him #20 tabs for 5 days. He will need to make it until Friday with his script from me or he can go elsewhere. He should continue his voltaren and take tylenol as needed in the meantime.

## 2014-10-01 NOTE — Telephone Encounter (Signed)
Left a detailed message letting pt know message from [place].

## 2014-10-10 ENCOUNTER — Emergency Department (HOSPITAL_COMMUNITY)
Admission: EM | Admit: 2014-10-10 | Discharge: 2014-10-10 | Disposition: A | Discharge status: Home / Self Care | Payer: BC Managed Care – PPO | Attending: Emergency Medicine | Admitting: Emergency Medicine

## 2014-10-10 ENCOUNTER — Encounter (HOSPITAL_COMMUNITY): Payer: Self-pay | Admitting: Nurse Practitioner

## 2014-10-10 DIAGNOSIS — K088 Other specified disorders of teeth and supporting structures: Secondary | ICD-10-CM | POA: Diagnosis not present

## 2014-10-10 DIAGNOSIS — K589 Irritable bowel syndrome without diarrhea: Secondary | ICD-10-CM | POA: Diagnosis not present

## 2014-10-10 DIAGNOSIS — F419 Anxiety disorder, unspecified: Secondary | ICD-10-CM | POA: Diagnosis not present

## 2014-10-10 DIAGNOSIS — Z79899 Other long term (current) drug therapy: Secondary | ICD-10-CM | POA: Insufficient documentation

## 2014-10-10 DIAGNOSIS — G43909 Migraine, unspecified, not intractable, without status migrainosus: Secondary | ICD-10-CM | POA: Insufficient documentation

## 2014-10-10 DIAGNOSIS — K0381 Cracked tooth: Secondary | ICD-10-CM | POA: Diagnosis not present

## 2014-10-10 DIAGNOSIS — E039 Hypothyroidism, unspecified: Secondary | ICD-10-CM | POA: Diagnosis not present

## 2014-10-10 DIAGNOSIS — Z792 Long term (current) use of antibiotics: Secondary | ICD-10-CM | POA: Diagnosis not present

## 2014-10-10 DIAGNOSIS — K0889 Other specified disorders of teeth and supporting structures: Secondary | ICD-10-CM

## 2014-10-10 MED ORDER — BUPIVACAINE-EPINEPHRINE 0.5% -1:200000 IJ SOLN
1.8000 mL | Freq: Once | INTRAMUSCULAR | Status: DC
  Filled 2014-10-10: qty 1.8

## 2014-10-10 MED ORDER — BUPIVACAINE-EPINEPHRINE (PF) 0.5% -1:200000 IJ SOLN
1.8000 mL | Freq: Once | INTRAMUSCULAR | Status: AC
  Administered 2014-10-10: 1.8 mL

## 2014-10-10 MED ORDER — HYDROCODONE-ACETAMINOPHEN 5-325 MG PO TABS: 1.0000 | ORAL_TABLET | ORAL | Status: DC | PRN

## 2014-10-10 MED ORDER — PENICILLIN V POTASSIUM 500 MG PO TABS: 500.0000 mg | ORAL_TABLET | Freq: Four times a day (QID) | ORAL | Status: DC

## 2014-10-10 NOTE — Discharge Instructions (Signed)
Dental Pain °A tooth ache may be caused by cavities (tooth decay). Cavities expose the nerve of the tooth to air and hot or cold temperatures. It may come from an infection or abscess (also called a boil or furuncle) around your tooth. It is also often caused by dental caries (tooth decay). This causes the pain you are having. °DIAGNOSIS  °Your caregiver can diagnose this problem by exam. °TREATMENT  °· If caused by an infection, it may be treated with medications which kill germs (antibiotics) and pain medications as prescribed by your caregiver. Take medications as directed. °· Only take over-the-counter or prescription medicines for pain, discomfort, or fever as directed by your caregiver. °· Whether the tooth ache today is caused by infection or dental disease, you should see your dentist as soon as possible for further care. °SEEK MEDICAL CARE IF: °The exam and treatment you received today has been provided on an emergency basis only. This is not a substitute for complete medical or dental care. If your problem worsens or new problems (symptoms) appear, and you are unable to meet with your dentist, call or return to this location. °SEEK IMMEDIATE MEDICAL CARE IF:  °· You have a fever. °· You develop redness and swelling of your face, jaw, or neck. °· You are unable to open your mouth. °· You have severe pain uncontrolled by pain medicine. °MAKE SURE YOU:  °· Understand these instructions. °· Will watch your condition. °· Will get help right away if you are not doing well or get worse. °Document Released: 04/18/2005 Document Revised: 07/11/2011 Document Reviewed: 12/05/2007 °ExitCare® Patient Information ©2015 [employer], [employer]. This information is not intended to replace advice given to you by your health care provider. Make sure you discuss any questions you have with your health care provider. ° °Dental Care and Dentist Visits °Dental care supports good overall health. Regular dental visits can also help you  avoid dental pain, bleeding, infection, and other more serious health problems in the future. It is important to keep the mouth healthy because diseases in the teeth, gums, and other oral tissues can spread to other areas of the body. Some problems, such as diabetes, heart disease, and pre-term labor have been associated with poor oral health.  °See your dentist every 6 months. If you experience emergency problems such as a toothache or broken tooth, go to the dentist right away. If you see your dentist regularly, you may catch problems early. It is easier to be treated for problems in the early stages.  °WHAT TO EXPECT AT A DENTIST VISIT  °Your dentist will look for many common oral health problems and recommend proper treatment. At your regular dental visit, you can expect: °· Gentle cleaning of the teeth and gums. This includes scraping and polishing. This helps to remove the sticky substance around the teeth and gums (plaque). Plaque forms in the mouth shortly after eating. Over time, plaque hardens on the teeth as tartar. If tartar is not removed regularly, it can cause problems. Cleaning also helps remove stains. °· Periodic X-rays. These pictures of the teeth and supporting bone will help your dentist assess the health of your teeth. °· Periodic fluoride treatments. Fluoride is a natural mineral shown to help strengthen teeth. Fluoride treatment involves applying a fluoride gel or varnish to the teeth. It is most commonly done in children. °· Examination of the mouth, tongue, jaws, teeth, and gums to look for any oral health problems, such as: °¨ Cavities (dental caries). This is   decay on the tooth caused by plaque, sugar, and acid in the mouth. It is best to catch a cavity when it is small. °¨ Inflammation of the gums caused by plaque buildup (gingivitis). °¨ Problems with the mouth or malformed or misaligned teeth. °¨ Oral cancer or other diseases of the soft tissues or jaws.  °KEEP YOUR TEETH AND GUMS  HEALTHY °For healthy teeth and gums, follow these general guidelines as well as your dentist's specific advice: °· Have your teeth professionally cleaned at the dentist every 6 months. °· Brush twice daily with a fluoride toothpaste. °· Floss your teeth daily.  °· Ask your dentist if you need fluoride supplements, treatments, or fluoride toothpaste. °· Eat a healthy diet. Reduce foods and drinks with added sugar. °· Avoid smoking. °TREATMENT FOR ORAL HEALTH PROBLEMS °If you have oral health problems, treatment varies depending on the conditions present in your teeth and gums. °· Your caregiver will most likely recommend good oral hygiene at each visit. °· For cavities, gingivitis, or other oral health disease, your caregiver will perform a procedure to treat the problem. This is typically done at a separate appointment. Sometimes your caregiver will refer you to another dental specialist for specific tooth problems or for surgery. °SEEK IMMEDIATE DENTAL CARE IF: °· You have pain, bleeding, or soreness in the gum, tooth, jaw, or mouth area. °· A permanent tooth becomes loose or separated from the gum socket. °· You experience a blow or injury to the mouth or jaw area. °Document Released: 12/29/2010 Document Revised: 07/11/2011 Document Reviewed: 12/29/2010 °ExitCare® Patient Information ©2015 [employer], [employer]. This information is not intended to replace advice given to you by your health care provider. Make sure you discuss any questions you have with your health care provider. ° °

## 2014-10-10 NOTE — ED Notes (Signed)
Pt endorses right lower back molar pain started 2 days ago. Pt sts called dentist emergency line and left a message. Pt has alternated between tylenol, Advil initially and used orajel with minimal relief. Pt stopped taking tylenol and took an old rx for Vicodin with some relief. Pt denies known injury, partial tooth missing in area of pain.

## 2014-10-10 NOTE — ED Provider Notes (Signed)
CSN: [id number]     Arrival date & time 10/10/14  1506 History  This chart was scribed for non-physician practitioner Everlene Farrier PA-C working with Jerelyn Scott, MD by Lyndel Safe, ED Scribe. This patient was seen in room TR05C/TR05C and the patient's care was started at 3:22 PM.   Chief Complaint  Patient presents with  . Dental Pain   The history is provided by the patient. No language interpreter was used.   HPI Comments: Gregory Bush is a 34 y.o. male, with a PMhx of hypothyroidism and anxiety, who presents to the Emergency Department complaining of a progressively worsening, constant, moderate (7/10), right, lower molar pain onset 2 days ago. Pt notes associated rhinorrhea, sore throat, mild right otalgia and right-sided mandible pain. He reports the pain was a 10/10 last night but has improved only slightly today. His current pain is 7/10. He states the earliest appointment he could get with his dentist, Dr. Tresa Moore, is in 4 days. Pt has called his dentist office's emergency line and left a message but has yet to hear back and so decided to come into the ED. He has taken Advil and tylenol and used Orajel pta with no relief. Pt has also taken an old Vicodin prescription with mild relief. Denies dysphagia, neck pain/stiffness, eye pain, changes to vision, fever, or chills. He has been able to eat and drink.   Past Medical History  Diagnosis Date  . Migraines   . Hypothyroidism   . Anxiety   . IBS (irritable bowel syndrome)    Past Surgical History  Procedure Laterality Date  . Eye muscle surgery Bilateral 1985  . Colonoscopy w/ biopsies    . Esophagogastroduodenoscopy    . Hip surgery     Family History  Problem Relation Age of Onset  . Breast cancer Mother   . Pancreatic cancer Mother   . Diabetes Mother   . Heart disease Mother   . Prostate cancer Father   . Heart failure Maternal Grandmother    History  Substance Use Topics  . Smoking status: Never Smoker   .  Smokeless tobacco: Never Used  . Alcohol Use: No    Review of Systems  Constitutional: Negative for fever and chills.  HENT: Positive for dental problem, ear pain, rhinorrhea and sore throat. Negative for ear discharge, facial swelling and trouble swallowing.   Eyes: Negative for pain and visual disturbance.  Gastrointestinal: Negative for nausea, vomiting and abdominal pain.  Musculoskeletal: Negative for neck pain and neck stiffness.  Skin: Negative for rash.  Neurological: Negative for light-headedness and headaches.    Allergies  Nsaids  Home Medications   Prior to Admission medications   Medication Sig Start Date End Date Taking? Authorizing Provider  ALPRAZolam Prudy Feeler) 1 MG tablet  09/10/14   Historical Provider, MD  citalopram (CELEXA) 20 MG tablet  08/11/14   Historical Provider, MD  clonazePAM (KLONOPIN) 1 MG tablet Take 1 mg by mouth 2 (two) times daily. 08/28/14   Historical Provider, MD  HYDROcodone-acetaminophen (NORCO/VICODIN) 5-325 MG per tablet Take 1-2 tablets by mouth every 4 (four) hours as needed. 10/10/14   Everlene Farrier, PA-C  levothyroxine (SYNTHROID, LEVOTHROID) 50 MCG tablet Take 50 mcg by mouth daily.      Historical Provider, MD  penicillin v potassium (VEETID) 500 MG tablet Take 1 tablet (500 mg total) by mouth 4 (four) times daily. 10/10/14   Everlene Farrier, PA-C  rifaximin (XIFAXAN) 550 MG TABS tablet Take 1 tablet (550 mg total)  by mouth 3 (three) times daily. 05/07/14   Iva Boop, MD  SUMAtriptan (IMITREX) 100 MG tablet  08/12/14   Historical Provider, MD   BP 152/98 mmHg  Pulse 92  Temp(Src) 98.8 F (37.1 C) (Oral)  Resp 16  SpO2 100%  Physical Exam  Constitutional: He is oriented to person, place, and time. He appears well-developed and well-nourished. No distress.  Nontoxic appearing.  HENT:  Head: Normocephalic and atraumatic.  Right Ear: External ear normal.  Left Ear: External ear normal.  Mouth/Throat: No oropharyngeal exudate.   Bilateral tympanic membranes are pearly-gray without erythema or loss of landmarks.  Uvula midline without edema. Soft palate rises symmetrically. Generally poor dentition. Patient has a cracked right lower molar. No abscess, facial swelling, induration or fluctuance, or drainage.  Eyes: Conjunctivae are normal. Pupils are equal, round, and reactive to light. Right eye exhibits no discharge. Left eye exhibits no discharge.  Neck: Normal range of motion. Neck supple. No JVD present. No tracheal deviation present.  Cardiovascular: Normal rate, regular rhythm, normal heart sounds and intact distal pulses.   Pulmonary/Chest: Effort normal and breath sounds normal. No respiratory distress.  Abdominal: Soft. There is no tenderness.  Lymphadenopathy:    He has no cervical adenopathy.  Neurological: He is alert and oriented to person, place, and time. No cranial nerve deficit. Coordination normal.  Cranial nerves are intact.  Skin: Skin is warm and dry. No rash noted. He is not diaphoretic.  Psychiatric: He has a normal mood and affect. His behavior is normal.  Nursing note and vitals reviewed.  ED Course  Procedures  DIAGNOSTIC STUDIES: Oxygen Saturation is 100% on RA, normal by my interpretation.    COORDINATION OF CARE: 3:26 PM Discussed treatment plan which includes to order and prescribe antibiotics and perform dental block procedure. Pt acknowledges and agrees to plan.  3:48 PM Dental block performed.  Labs Review Labs Reviewed - No data to display  Imaging Review No results found.   EKG Interpretation None      Filed Vitals:   10/10/14 1510 10/10/14 1557  BP: 152/98 144/74  Pulse: 92 77  Temp: 98.8 F (37.1 C) 98.8 F (37.1 C)  TempSrc: Oral Oral  Resp: 16 16  SpO2: 100% 99%     MDM   Meds given in ED:  Medications  bupivacaine-epinephrine (MARCAINE W/ EPI) 0.5% -1:200000 injection 1.8 mL (1.8 mLs Infiltration Given 10/10/14 1555)    Discharge Medication List  as of 10/10/2014  3:58 PM    START taking these medications   Details  penicillin v potassium (VEETID) 500 MG tablet Take 1 tablet (500 mg total) by mouth 4 (four) times daily., Starting 10/10/2014, Until Discontinued, Print        Final diagnoses:  Pain, dental    Patient with toothache.  No gross abscess.  Exam unconcerning for Ludwig's angina or spread of infection.  Dental block performed by Dierdre Forth, PA-C. Will treat with penicillin and pain medicine.  Patient given follow-up instructions with dentist Dr. Mayford Knife. I advised the patient to follow-up with their primary care provider this week. I advised the patient to return to the emergency department with new or worsening symptoms or new concerns. The patient verbalized understanding and agreement with plan.    I personally performed the services described in this documentation, which was scribed in my presence. The recorded information has been reviewed and is accurate.  Everlene Farrier, PA-C 10/10/14 1630  Jerelyn Scott, MD 10/10/14 [phone]

## 2014-12-16 ENCOUNTER — Emergency Department (HOSPITAL_COMMUNITY): Payer: BC Managed Care – PPO

## 2014-12-16 ENCOUNTER — Emergency Department (HOSPITAL_COMMUNITY)
Admission: EM | Admit: 2014-12-16 | Discharge: 2014-12-16 | Disposition: A | Discharge status: Home / Self Care | Payer: BC Managed Care – PPO | Attending: Emergency Medicine | Admitting: Emergency Medicine

## 2014-12-16 ENCOUNTER — Encounter (HOSPITAL_COMMUNITY): Payer: Self-pay

## 2014-12-16 DIAGNOSIS — S0990XA Unspecified injury of head, initial encounter: Secondary | ICD-10-CM | POA: Insufficient documentation

## 2014-12-16 DIAGNOSIS — G43909 Migraine, unspecified, not intractable, without status migrainosus: Secondary | ICD-10-CM | POA: Insufficient documentation

## 2014-12-16 DIAGNOSIS — Z8639 Personal history of other endocrine, nutritional and metabolic disease: Secondary | ICD-10-CM | POA: Insufficient documentation

## 2014-12-16 DIAGNOSIS — R569 Unspecified convulsions: Secondary | ICD-10-CM | POA: Diagnosis not present

## 2014-12-16 DIAGNOSIS — R0681 Apnea, not elsewhere classified: Secondary | ICD-10-CM | POA: Diagnosis not present

## 2014-12-16 DIAGNOSIS — Y9389 Activity, other specified: Secondary | ICD-10-CM | POA: Insufficient documentation

## 2014-12-16 DIAGNOSIS — Y998 Other external cause status: Secondary | ICD-10-CM | POA: Insufficient documentation

## 2014-12-16 DIAGNOSIS — W1839XA Other fall on same level, initial encounter: Secondary | ICD-10-CM | POA: Insufficient documentation

## 2014-12-16 DIAGNOSIS — Y9289 Other specified places as the place of occurrence of the external cause: Secondary | ICD-10-CM | POA: Insufficient documentation

## 2014-12-16 DIAGNOSIS — S023XXA Fracture of orbital floor, initial encounter for closed fracture: Secondary | ICD-10-CM | POA: Diagnosis not present

## 2014-12-16 DIAGNOSIS — Z79899 Other long term (current) drug therapy: Secondary | ICD-10-CM | POA: Insufficient documentation

## 2014-12-16 DIAGNOSIS — S01311A Laceration without foreign body of right ear, initial encounter: Secondary | ICD-10-CM

## 2014-12-16 DIAGNOSIS — S0292XA Unspecified fracture of facial bones, initial encounter for closed fracture: Secondary | ICD-10-CM

## 2014-12-16 DIAGNOSIS — R4689 Other symptoms and signs involving appearance and behavior: Secondary | ICD-10-CM

## 2014-12-16 DIAGNOSIS — F419 Anxiety disorder, unspecified: Secondary | ICD-10-CM | POA: Insufficient documentation

## 2014-12-16 LAB — BASIC METABOLIC PANEL
ANION GAP: 12 (ref 5–15)
BUN: 10 mg/dL (ref 6–20)
CALCIUM: 9.4 mg/dL (ref 8.9–10.3)
CO2: 24 mmol/L (ref 22–32)
CREATININE: 1.1 mg/dL (ref 0.61–1.24)
Chloride: 102 mmol/L (ref 101–111)
Glucose, Bld: 152 mg/dL — ABNORMAL HIGH (ref 65–99)
Potassium: 3.6 mmol/L (ref 3.5–5.1)
SODIUM: 138 mmol/L (ref 135–145)

## 2014-12-16 LAB — URINE MICROSCOPIC-ADD ON

## 2014-12-16 LAB — RAPID URINE DRUG SCREEN, HOSP PERFORMED
Amphetamines: NOT DETECTED
BARBITURATES: NOT DETECTED
Benzodiazepines: POSITIVE — AB
Cocaine: NOT DETECTED
Opiates: POSITIVE — AB
Tetrahydrocannabinol: NOT DETECTED

## 2014-12-16 LAB — CBC WITH DIFFERENTIAL/PLATELET
BASOS ABS: 0 10*3/uL (ref 0.0–0.1)
BASOS PCT: 0 % (ref 0–1)
EOS ABS: 0 10*3/uL (ref 0.0–0.7)
Eosinophils Relative: 0 % (ref 0–5)
HCT: 40.6 % (ref 39.0–52.0)
HEMOGLOBIN: 13.9 g/dL (ref 13.0–17.0)
Lymphocytes Relative: 5 % — ABNORMAL LOW (ref 12–46)
Lymphs Abs: 0.9 10*3/uL (ref 0.7–4.0)
MCH: 30.5 pg (ref 26.0–34.0)
MCHC: 34.2 g/dL (ref 30.0–36.0)
MCV: 89.2 fL (ref 78.0–100.0)
MONOS PCT: 5 % (ref 3–12)
Monocytes Absolute: 0.9 10*3/uL (ref 0.1–1.0)
NEUTROS PCT: 90 % — AB (ref 43–77)
Neutro Abs: 15.9 10*3/uL — ABNORMAL HIGH (ref 1.7–7.7)
Platelets: 194 10*3/uL (ref 150–400)
RBC: 4.55 MIL/uL (ref 4.22–5.81)
RDW: 12.7 % (ref 11.5–15.5)
WBC: 17.7 10*3/uL — ABNORMAL HIGH (ref 4.0–10.5)

## 2014-12-16 LAB — URINALYSIS, ROUTINE W REFLEX MICROSCOPIC
Bilirubin Urine: NEGATIVE
GLUCOSE, UA: 250 mg/dL — AB
KETONES UR: NEGATIVE mg/dL
LEUKOCYTES UA: NEGATIVE
NITRITE: NEGATIVE
PROTEIN: 30 mg/dL — AB
Specific Gravity, Urine: 1.038 — ABNORMAL HIGH (ref 1.005–1.030)
UROBILINOGEN UA: 0.2 mg/dL (ref 0.0–1.0)
pH: 5.5 (ref 5.0–8.0)

## 2014-12-16 LAB — CBG MONITORING, ED: GLUCOSE-CAPILLARY: 133 mg/dL — AB (ref 65–99)

## 2014-12-16 MED ORDER — FENTANYL CITRATE (PF) 100 MCG/2ML IJ SOLN
100.0000 ug | Freq: Once | INTRAMUSCULAR | Status: AC
  Administered 2014-12-16: 100 ug via INTRAVENOUS
  Filled 2014-12-16: qty 2

## 2014-12-16 MED ORDER — SODIUM CHLORIDE 0.9 % IV BOLUS (SEPSIS)
1000.0000 mL | Freq: Once | INTRAVENOUS | Status: AC
  Administered 2014-12-16: 1000 mL via INTRAVENOUS

## 2014-12-16 MED ORDER — OXYMETAZOLINE HCL 0.05 % NA SOLN
1.0000 | Freq: Two times a day (BID) | NASAL | Status: DC
Start: 2014-12-16 — End: 2014-12-26

## 2014-12-16 MED ORDER — ONDANSETRON HCL 4 MG/2ML IJ SOLN
4.0000 mg | Freq: Once | INTRAMUSCULAR | Status: AC
  Administered 2014-12-16: 4 mg via INTRAVENOUS
  Filled 2014-12-16: qty 2

## 2014-12-16 MED ORDER — LIDOCAINE HCL (PF) 1 % IJ SOLN
5.0000 mL | Freq: Once | INTRAMUSCULAR | Status: AC
  Administered 2014-12-16: 5 mL via INTRADERMAL
  Filled 2014-12-16: qty 5

## 2014-12-16 MED ORDER — OXYCODONE HCL 5 MG PO TABS: 5.0000 mg | ORAL_TABLET | ORAL | Status: DC | PRN

## 2014-12-16 NOTE — ED Provider Notes (Signed)
CSN: [id number]     Arrival date & time 12/16/14  1016 History   First MD Initiated Contact with Patient 12/16/14 1021     Chief Complaint  Patient presents with  . Seizures     (Consider location/radiation/quality/duration/timing/severity/associated sxs/prior Treatment) Patient is a 34 y.o. male presenting with seizures. The history is provided by the patient.  Seizures Seizure activity on arrival: no   Seizure type:  Grand mal Preceding symptoms: no sensation of an aura present   Initial focality:  None Episode characteristics: generalized shaking, stiffening and unresponsiveness   Postictal symptoms: confusion and somnolence   Return to baseline: yes   Severity:  Moderate Timing:  Once Number of seizures this episode:  1 Progression:  Resolved Recent head injury:  During the event PTA treatment:  None History of seizures: no    34 yo M with a chief complaint of a possible seizure. Per family patient was in court today during a recess the patient started swinging at the air and then fell to the ground landing face first. He said he then had generalized shaking. Bit his tongue. Deny loss of bowel or bladder. Patient denies illegal drug use. Was brought by EMS in a backboard and collar. Per EMS was significantly altered when they arrived. Now patient is back to baseline. Complaining of right-sided facial and jaw pain. Small laceration to the right ear.  Past Medical History  Diagnosis Date  . Migraines   . Hypothyroidism   . Anxiety   . IBS (irritable bowel syndrome)    Past Surgical History  Procedure Laterality Date  . Eye muscle surgery Bilateral 1985  . Colonoscopy w/ biopsies    . Esophagogastroduodenoscopy    . Hip surgery     Family History  Problem Relation Age of Onset  . Breast cancer Mother   . Pancreatic cancer Mother   . Diabetes Mother   . Heart disease Mother   . Prostate cancer Father   . Heart failure Maternal Grandmother    Social History    Substance Use Topics  . Smoking status: Never Smoker   . Smokeless tobacco: Never Used  . Alcohol Use: No    Review of Systems  Constitutional: Negative for fever and chills.  HENT: Positive for facial swelling. Negative for congestion.   Eyes: Negative for discharge and visual disturbance.  Respiratory: Negative for shortness of breath.   Cardiovascular: Negative for chest pain and palpitations.  Gastrointestinal: Negative for vomiting, abdominal pain and diarrhea.  Musculoskeletal: Negative for myalgias and arthralgias.  Skin: Negative for color change and rash.  Neurological: Positive for seizures and headaches. Negative for tremors and syncope.  Psychiatric/Behavioral: Negative for confusion and dysphoric mood.      Allergies  Nsaids  Home Medications   Prior to Admission medications   Medication Sig Start Date End Date Taking? Authorizing Provider  ALPRAZolam Prudy Feeler) 1 MG tablet Take 1 mg by mouth 3 (three) times daily as needed for anxiety.  12/01/14  Yes Historical Provider, MD  sertraline (ZOLOFT) 50 MG tablet Take 50 mg by mouth daily. 12/01/14  Yes Historical Provider, MD  oxyCODONE (ROXICODONE) 5 MG immediate release tablet Take 1 tablet (5 mg total) by mouth every 4 (four) hours as needed for severe pain. 12/16/14   Melene Plan, DO  oxymetazoline (AFRIN NASAL SPRAY) 0.05 % nasal spray Place 1 spray into both nostrils 2 (two) times daily. For 2 days 12/16/14   Melene Plan, DO   BP 127/74 mmHg  Pulse 93  Temp(Src) 98.1 F (36.7 C) (Oral)  Resp 14  Wt 160 lb (72.576 kg)  SpO2 97% Physical Exam  Constitutional: He is oriented to person, place, and time. He appears well-developed and well-nourished.  HENT:  Head: Atraumatic.  Ears:  Blood noted at the right tympanic membrane. Difficult to ascertain whether or not this is in front or behind TM. Patient does have a small laceration to the external ear. Tender to palpation to the right zygomatic arch. Patient was subjective  decreased hearing on that side. No noted facial nerve palsy.  Eyes: EOM are normal. Pupils are equal, round, and reactive to light.  Neck: Normal range of motion. Neck supple. No JVD present. No tracheal deviation present.  Cardiovascular: Normal rate and regular rhythm.  Exam reveals no gallop and no friction rub.   No murmur heard. Pulmonary/Chest: No respiratory distress. He has no wheezes.  Abdominal: He exhibits no distension. There is no rebound and no guarding.  Musculoskeletal: Normal range of motion. He exhibits tenderness. He exhibits no edema.  Patient palpated from the toe without any midline spinal tenderness. No noted abdominal pain. Patient without chest wall pain. No noted upper or lower extremity tenderness to palpation.  Neurological: He is alert and oriented to person, place, and time.  Skin: No rash noted. No pallor.  Psychiatric: He has a normal mood and affect. His behavior is normal.    ED Course  LACERATION REPAIR Date/Time: 12/16/2014 5:03 PM Performed by: Adela Lank Laysa Kimmey Authorized by: Melene Plan Consent: Verbal consent obtained. Consent given by: patient Required items: required blood products, implants, devices, and special equipment available Body area: head/neck Location details: right ear Laceration length: 1 cm Tendon involvement: none Nerve involvement: none Vascular damage: no Anesthesia: local infiltration Local anesthetic: lidocaine 1% without epinephrine Anesthetic total: 5 ml Patient sedated: no Preparation: Patient was prepped and draped in the usual sterile fashion. Irrigation solution: saline Irrigation method: jet lavage Amount of cleaning: extensive Debridement: none Degree of undermining: none Wound skin closure material used: 6-0 fast absorbing gut. Number of sutures: 2 Technique: simple Approximation: close Approximation difficulty: simple Patient tolerance: Patient tolerated the procedure well with no immediate complications    (including critical care time) Labs Review Labs Reviewed  URINE RAPID DRUG SCREEN, HOSP PERFORMED - Abnormal; Notable for the following:    Opiates POSITIVE (*)    Benzodiazepines POSITIVE (*)    All other components within normal limits  URINALYSIS, ROUTINE W REFLEX MICROSCOPIC (NOT AT [facility]) - Abnormal; Notable for the following:    Specific Gravity, Urine 1.038 (*)    Glucose, UA 250 (*)    Hgb urine dipstick MODERATE (*)    Protein, ur 30 (*)    All other components within normal limits  BASIC METABOLIC PANEL - Abnormal; Notable for the following:    Glucose, Bld 152 (*)    All other components within normal limits  CBC WITH DIFFERENTIAL/PLATELET - Abnormal; Notable for the following:    WBC 17.7 (*)    Neutrophils Relative % 90 (*)    Neutro Abs 15.9 (*)    Lymphocytes Relative 5 (*)    All other components within normal limits  URINE MICROSCOPIC-ADD ON - Abnormal; Notable for the following:    Bacteria, UA FEW (*)    Casts HYALINE CASTS (*)    All other components within normal limits  CBG MONITORING, ED - Abnormal; Notable for the following:    Glucose-Capillary 133 (*)  All other components within normal limits  CBC WITH DIFFERENTIAL/PLATELET    Imaging Review Dg Chest 2 View  12/16/2014   CLINICAL DATA:  Fall, seizure.  EXAM: CHEST  2 VIEW  COMPARISON:  06/13/2012  FINDINGS: The heart size and mediastinal contours are within normal limits. Both lungs are clear. The visualized skeletal structures are unremarkable.  IMPRESSION: No active cardiopulmonary disease.   Electronically Signed   By: Charlett Nose M.D.   On: 12/16/2014 11:15   Ct Head Wo Contrast  12/16/2014   CLINICAL DATA:  Seizure with fall  EXAM: CT HEAD WITHOUT CONTRAST  CT MAXILLOFACIAL WITHOUT CONTRAST  TECHNIQUE: Multidetector CT imaging of the head and maxillofacial structures were performed using the standard protocol without intravenous contrast. Multiplanar CT image reconstructions of the maxillofacial  structures were also generated.  COMPARISON:  None.  FINDINGS: CT HEAD FINDINGS  The ventricles are normal in size and configuration. There is no intracranial mass, hemorrhage, extra-axial fluid collection, or midline shift. Gray-white compartments are normal. No acute infarct evident. The bony calvarium appears intact. The mastoid air cells are clear.  CT MAXILLOFACIAL FINDINGS  There is a fracture of the orbital floor on the right. A small amount of fat extends into the superior right maxillary antrum. A portion of the right inferior rectus muscle also extends slightly inferiorly.  There are fractures of the lateral right maxillary antrum with fracture fragment extending slightly medially. There is air-fluid level in the right maxillary antrum.  There is a comminuted fracture of the right mandibular ramus slightly lateral and superior to the angle of the mandible. Major fracture fragments are in near anatomic alignment.  No other fractures are identified. No dislocations are apparent. No intraorbital lesions are identified. There is soft tissue swelling over the right face.  Air-fluid level with mucosal thickening noted in the right maxillary antrum. There is mucosal thickening in the medial superior right maxillary antrum obstructing the ostiomeatal unit complex on the right. There is debris in the right sphenoid sinus. Other paranasal sinuses are clear. The left ostiomeatal unit complex is patent. There is leftward deviation the nasal septum without nares obstruction.  Salivary glands appear normal bilaterally. No adenopathy appreciable. There is moderate dental caries.  IMPRESSION: CT head: Study within normal limits.  CT maxillofacial: Fracture of the orbital floor on the right with a small amount of fat protruding into the superior lateral right maxillary antrum. The right inferior rectus muscle also appears to partially extend inferiorly. There is a comminuted fracture of the lateral right maxillary antrum  with a fracture fragment displaced medially into the right maxillary antrum. There is a comminuted fracture of the ramus of the mandible on the right with fracture fragments overall in near anatomic alignment.  No other fractures. No dislocation. Soft tissue swelling right face, predominantly laterally.  No intraorbital lesions. Obstruction right ostiomeatal unit complex. Left ostiomeatal unit complex patent.   Electronically Signed   By: Bretta Bang III M.D.   On: 12/16/2014 11:30   Ct Maxillofacial Wo Cm  12/16/2014   CLINICAL DATA:  Seizure with fall  EXAM: CT HEAD WITHOUT CONTRAST  CT MAXILLOFACIAL WITHOUT CONTRAST  TECHNIQUE: Multidetector CT imaging of the head and maxillofacial structures were performed using the standard protocol without intravenous contrast. Multiplanar CT image reconstructions of the maxillofacial structures were also generated.  COMPARISON:  None.  FINDINGS: CT HEAD FINDINGS  The ventricles are normal in size and configuration. There is no intracranial mass, hemorrhage, extra-axial fluid  collection, or midline shift. Gray-white compartments are normal. No acute infarct evident. The bony calvarium appears intact. The mastoid air cells are clear.  CT MAXILLOFACIAL FINDINGS  There is a fracture of the orbital floor on the right. A small amount of fat extends into the superior right maxillary antrum. A portion of the right inferior rectus muscle also extends slightly inferiorly.  There are fractures of the lateral right maxillary antrum with fracture fragment extending slightly medially. There is air-fluid level in the right maxillary antrum.  There is a comminuted fracture of the right mandibular ramus slightly lateral and superior to the angle of the mandible. Major fracture fragments are in near anatomic alignment.  No other fractures are identified. No dislocations are apparent. No intraorbital lesions are identified. There is soft tissue swelling over the right face.  Air-fluid  level with mucosal thickening noted in the right maxillary antrum. There is mucosal thickening in the medial superior right maxillary antrum obstructing the ostiomeatal unit complex on the right. There is debris in the right sphenoid sinus. Other paranasal sinuses are clear. The left ostiomeatal unit complex is patent. There is leftward deviation the nasal septum without nares obstruction.  Salivary glands appear normal bilaterally. No adenopathy appreciable. There is moderate dental caries.  IMPRESSION: CT head: Study within normal limits.  CT maxillofacial: Fracture of the orbital floor on the right with a small amount of fat protruding into the superior lateral right maxillary antrum. The right inferior rectus muscle also appears to partially extend inferiorly. There is a comminuted fracture of the lateral right maxillary antrum with a fracture fragment displaced medially into the right maxillary antrum. There is a comminuted fracture of the ramus of the mandible on the right with fracture fragments overall in near anatomic alignment.  No other fractures. No dislocation. Soft tissue swelling right face, predominantly laterally.  No intraorbital lesions. Obstruction right ostiomeatal unit complex. Left ostiomeatal unit complex patent.   Electronically Signed   By: Bretta Bang III M.D.   On: 12/16/2014 11:30   I have personally reviewed and evaluated these images and lab results as part of my medical decision-making.   EKG Interpretation   Date/Time:  Tuesday December 16 2014 11:04:26 EDT Ventricular Rate:  96 PR Interval:  144 QRS Duration: 105 QT Interval:  382 QTC Calculation: 483 R Axis:   121 Text Interpretation:  Sinus rhythm Right axis deviation Borderline  prolonged QT interval No significant change was found Confirmed by Javeion Cannedy  MD, Reuel Boom [id number]) on 12/16/2014 4:18:26 PM      MDM   Final diagnoses:  Spell of abnormal behavior  Seizure-like activity  Multiple facial fractures,  closed, initial encounter  Laceration of ear canal, right, initial encounter    34 yo M with a chief complaint of possible seizure-like activity. Patient with no prior history of seizures. Recently started on Zoloft. Significant history of anxiety. Currently in court for illegally obtaining narcotics. With blood to the right TM will obtain a CT of her face. Chest x-ray lab evaluation. Possible need for outpatient neuro follow-up.  CT face with inferior orbital wall fracture on the right as well as right maxillary fracture.  Spoke with Dr. Leta Baptist, results discussed. Sees no need for urgent repair.   Laboratory evaluation with no acute abnormalities. Wound repaired at bedside. We'll have the patient follow-up with facial surgery.   I have discussed the diagnosis/risks/treatment options with the patient and believe the pt to be eligible for discharge home to follow-up  with Plastics. We also discussed returning to the ED immediately if new or worsening sx occur. We discussed the sx which are most concerning (e.g., fever, chills, drainage, repeat seizure) that necessitate immediate return. Medications administered to the patient during their visit and any new prescriptions provided to the patient are listed below.  Medications given during this visit Medications  fentaNYL (SUBLIMAZE) injection 100 mcg (100 mcg Intravenous Given 12/16/14 1119)  ondansetron (ZOFRAN) injection 4 mg (4 mg Intravenous Given 12/16/14 1116)  sodium chloride 0.9 % bolus 1,000 mL (0 mLs Intravenous Stopped 12/16/14 1445)  lidocaine (PF) (XYLOCAINE) 1 % injection 5 mL (5 mLs Intradermal Given 12/16/14 1403)  fentaNYL (SUBLIMAZE) injection 100 mcg (100 mcg Intravenous Given 12/16/14 1329)    Discharge Medication List as of 12/16/2014  2:29 PM    START taking these medications   Details  oxyCODONE (ROXICODONE) 5 MG immediate release tablet Take 1 tablet (5 mg total) by mouth every 4 (four) hours as needed for severe pain.,  Starting 12/16/2014, Until Discontinued, Print    oxymetazoline (AFRIN NASAL SPRAY) 0.05 % nasal spray Place 1 spray into both nostrils 2 (two) times daily. For 2 days, Starting 12/16/2014, Until Discontinued, Print         The patient appears reasonably screen and/or stabilized for discharge and I doubt any other medical condition or other [facility] requiring further screening, evaluation, or treatment in the ED at this time prior to discharge.    Melene Plan, DO 12/16/14 1704

## 2014-12-16 NOTE — ED Notes (Signed)
Suture cart at bedside 

## 2014-12-16 NOTE — ED Notes (Signed)
MD at the bedside completing Suture.

## 2014-12-16 NOTE — ED Notes (Signed)
Provider at bedside to suture

## 2014-12-16 NOTE — ED Notes (Signed)
Pt alert x4 respirations easy non labored. MAE randomly 

## 2014-12-16 NOTE — ED Notes (Addendum)
Bib ems r/t witnessed seizure with combative postictal period. Alert x3 c/o pain to rigt side of face, blood noted right ear canal confused to month

## 2014-12-16 NOTE — Discharge Instructions (Signed)
Driving and Equipment Restrictions °Some medical problems make it dangerous to drive, ride a bike, or use machines. Some of these problems are: °· A hard blow to the head (concussion). °· Passing out (fainting). °· Twitching and shaking (seizures). °· Low blood sugar. °· Taking medicine to help you relax (sedatives). °· Taking pain medicines. °· Wearing an eye patch. °· Wearing splints. This can make it hard to use parts of your body that you need to drive safely. °HOME CARE  °· Do not drive until your doctor says it is okay. °· Do not use machines until your doctor says it is okay. °You may need a form signed by your doctor (medical release) before you can drive again. You may also need this form before you do other tasks where you need to be fully alert. °MAKE SURE YOU: °· Understand these instructions. °· Will watch your condition. °· Will get help right away if you are not doing well or get worse. °Document Released: 05/26/2004 Document Revised: 07/11/2011 Document Reviewed: 08/26/2009 °ExitCare® Patient Information ©2015 [employer]. This information is not intended to replace advice given to you by your health care provider. Make sure you discuss any questions you have with your health care provider. ° °
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
## 2014-12-24 NOTE — Progress Notes (Signed)
Dr Jacklynn Bue with Anesthesia would like pt's surgery postponed until able to be seen by Neurology, or moved to the Main [facility] OR if surgery is imperative for 12/26/14.

## 2014-12-25 ENCOUNTER — Encounter (HOSPITAL_COMMUNITY): Payer: Self-pay | Admitting: Vascular Surgery

## 2014-12-25 ENCOUNTER — Encounter (HOSPITAL_COMMUNITY)
Admission: RE | Admit: 2014-12-25 | Discharge: 2014-12-25 | Disposition: A | Discharge status: Home / Self Care | Payer: BC Managed Care – PPO | Source: Ambulatory Visit | Attending: Plastic Surgery | Admitting: Plastic Surgery

## 2014-12-25 ENCOUNTER — Encounter (HOSPITAL_COMMUNITY): Payer: Self-pay | Admitting: Emergency Medicine

## 2014-12-25 ENCOUNTER — Emergency Department (HOSPITAL_COMMUNITY)
Admission: EM | Admit: 2014-12-25 | Discharge: 2014-12-25 | Disposition: A | Discharge status: Home / Self Care | Payer: BC Managed Care – PPO | Attending: Emergency Medicine | Admitting: Emergency Medicine

## 2014-12-25 DIAGNOSIS — Z8679 Personal history of other diseases of the circulatory system: Secondary | ICD-10-CM | POA: Diagnosis not present

## 2014-12-25 DIAGNOSIS — F0781 Postconcussional syndrome: Secondary | ICD-10-CM | POA: Diagnosis not present

## 2014-12-25 DIAGNOSIS — Z8639 Personal history of other endocrine, nutritional and metabolic disease: Secondary | ICD-10-CM | POA: Insufficient documentation

## 2014-12-25 DIAGNOSIS — F1910 Other psychoactive substance abuse, uncomplicated: Secondary | ICD-10-CM | POA: Diagnosis not present

## 2014-12-25 DIAGNOSIS — R413 Other amnesia: Secondary | ICD-10-CM | POA: Diagnosis present

## 2014-12-25 DIAGNOSIS — F1990 Other psychoactive substance use, unspecified, uncomplicated: Secondary | ICD-10-CM

## 2014-12-25 HISTORY — DX: Unspecified convulsions: R56.9

## 2014-12-25 MED ORDER — CEFAZOLIN SODIUM-DEXTROSE 2-3 GM-% IV SOLR: 2.0000 g | INTRAVENOUS | Status: DC

## 2014-12-25 MED ORDER — OXYCODONE-ACETAMINOPHEN 5-325 MG PO TABS
1.0000 | ORAL_TABLET | Freq: Once | ORAL | Status: AC
  Administered 2014-12-25: 1 via ORAL
  Filled 2014-12-25: qty 1

## 2014-12-25 NOTE — Pre-Procedure Instructions (Signed)
Gregory Bush  12/25/2014      [id number] [place], [place] - [address] [place] [address] Phone: [phone] Fax: [phone]  [facility] [place], [place] - [place], [place] - [address] [place] [address] Phone: [phone] Fax: [phone]    Your procedure is scheduled on  Friday  12/26/14  Report to [facility] Admitting at 530 A.M.  Call this number if you have problems the morning of surgery:  [phone]   Remember:  Do not eat food or drink liquids after midnight.  Take these medicines the morning of surgery with A SIP OF WATER  ALPRAZOLAM (XANAX) ,OXYCODONE IF NEEDED, SERTRALINE (ZOLOFT)   Do not wear jewelry, make-up or nail polish.  Do not wear lotions, powders, or perfumes.  You may wear deodorant.  Do not shave 48 hours prior to surgery.  Men may shave face and neck.  Do not bring valuables to the hospital.  [facility] is not responsible for any belongings or valuables.  Contacts, dentures or bridgework may not be worn into surgery.  Leave your suitcase in the car.  After surgery it may be brought to your room.  For patients admitted to the hospital, discharge time will be determined by your treatment team.  Patients discharged the day of surgery will not be allowed to drive home.   Name and phone number of your driver:    Special instructions:  [place] - Preparing for Surgery  Before surgery, you can play an important role.  Because skin is not sterile, your skin needs to be as free of germs as possible.  You can reduce the number of germs on you skin by washing with CHG (chlorahexidine gluconate) soap before surgery.  CHG is an antiseptic cleaner which kills germs and bonds with the skin to continue killing germs even after washing.  Please DO NOT use if you have an allergy to CHG or antibacterial soaps.  If your skin becomes  reddened/irritated stop using the CHG and inform your nurse when you arrive at Short Stay.  Do not shave (including legs and underarms) for at least 48 hours prior to the first CHG shower.  You may shave your face.  Please follow these instructions carefully:   1.  Shower with CHG Soap the night before surgery and the                                morning of Surgery.  2.  If you choose to wash your hair, wash your hair first as usual with your       normal shampoo.  3.  After you shampoo, rinse your hair and body thoroughly to remove the                      Shampoo.  4.  Use CHG as you would any other liquid soap.  You can apply chg directly       to the skin and wash gently with scrungie or a clean washcloth.  5.  Apply the CHG Soap to your body ONLY FROM THE NECK DOWN.        Do not use on open wounds or open sores.  Avoid contact with your eyes,       ears, mouth and genitals (private  parts).  Wash genitals (private parts)       with your normal soap.  6.  Wash thoroughly, paying special attention to the area where your surgery        will be performed.  7.  Thoroughly rinse your body with warm water from the neck down.  8.  DO NOT shower/wash with your normal soap after using and rinsing off       the CHG Soap.  9.  Pat yourself dry with a clean towel.            10.  Wear clean pajamas.            11.  Place clean sheets on your bed the night of your first shower and do not        sleep with pets.  Day of Surgery  Do not apply any lotions/deoderants the morning of surgery.  Please wear clean clothes to the hospital/surgery center.    Please read over the following fact sheets that you were given. Pain Booklet, Coughing and Deep Breathing and Surgical Site Infection Prevention

## 2014-12-25 NOTE — Progress Notes (Signed)
Upon entering PAT room to begin interview patient's wife began to tell how patient could not remember conversations they had, sleeps most of day, staggers, leans against wall etc.  Patient's spouse asked for help, stated patient never got neurology consult he was supposed to have and she is very concerned.  Spoke with Shonna Chock about situation and she suggested patient be taken to ED for evaluation.  Report was called to Tammy in ED, and I transported patient to ED via WC.  Also spoke with Joni Reining at Dr. Maude Leriche office and explained the situation and made aware patient was taken to ED.  Joni Reining stated she will notify Dr.Thimmappa.
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
## 2014-12-26 ENCOUNTER — Encounter (HOSPITAL_COMMUNITY): Admission: RE | Payer: Self-pay | Source: Ambulatory Visit

## 2014-12-26 ENCOUNTER — Ambulatory Visit (HOSPITAL_COMMUNITY): Admission: RE | Admit: 2014-12-26 | Payer: BC Managed Care – PPO | Source: Ambulatory Visit | Admitting: Plastic Surgery

## 2014-12-26 SURGERY — CLOSED REDUCTION, MANDIBLE, WITH ARCH BAR APPLICATION AND INTERMAXILLARY FIXATION
Anesthesia: General

## 2014-12-29 ENCOUNTER — Telehealth (HOSPITAL_COMMUNITY): Payer: Self-pay

## 2014-12-29 NOTE — Progress Notes (Signed)
Called pt for pre-op call and he states that the surgery has been cancelled because he has gotten a Nature conservation officer. I asked him if he had notified Dr. Maude Leriche office and he said yes, but he would call them again to make sure that they cancel the surgery.

## 2014-12-29 NOTE — Telephone Encounter (Signed)
Pt called wanted a new referral for oral surgeon instead of plastic surgeon.  Advised that I can not change MD referral order.

## 2014-12-29 NOTE — Progress Notes (Signed)
Called Dr. Maude Leriche office because pt is still on surgery schedule. Spoke with one of the nurses and she did not know about pt being cancelled. She states she will find out for sure and will call and cancel the surgery.

## 2014-12-30 ENCOUNTER — Ambulatory Visit (HOSPITAL_COMMUNITY): Admission: RE | Admit: 2014-12-30 | Payer: BC Managed Care – PPO | Source: Ambulatory Visit | Admitting: Plastic Surgery

## 2014-12-30 ENCOUNTER — Encounter (HOSPITAL_COMMUNITY): Admission: RE | Payer: Self-pay | Source: Ambulatory Visit

## 2014-12-30 SURGERY — CLOSED REDUCTION, MANDIBLE, WITH ARCH BAR APPLICATION AND INTERMAXILLARY FIXATION
Anesthesia: General | Laterality: Right

## 2015-03-24 ENCOUNTER — Emergency Department (INDEPENDENT_AMBULATORY_CARE_PROVIDER_SITE_OTHER)
Admission: EM | Admit: 2015-03-24 | Discharge: 2015-03-24 | Disposition: A | Discharge status: Home / Self Care | Payer: BC Managed Care – PPO | Source: Home / Self Care | Attending: Emergency Medicine | Admitting: Emergency Medicine

## 2015-03-24 ENCOUNTER — Encounter (HOSPITAL_COMMUNITY): Payer: Self-pay | Admitting: *Deleted

## 2015-03-24 DIAGNOSIS — K0889 Other specified disorders of teeth and supporting structures: Secondary | ICD-10-CM

## 2015-03-24 DIAGNOSIS — R6884 Jaw pain: Secondary | ICD-10-CM

## 2015-03-24 MED ORDER — OXYCODONE HCL 15 MG PO TABS: 15.0000 mg | ORAL_TABLET | ORAL | Status: DC | PRN

## 2015-03-24 MED ORDER — LIDOCAINE VISCOUS 2 % MT SOLN: 5.0000 mL | OROMUCOSAL | Status: DC | PRN

## 2015-03-24 NOTE — ED Notes (Signed)
Pt   Reports     Jaw   Pain   Pt  Had  Surgery   10  Days  Ago    And  reinjured the  l  Side  Of his  Jaw    yest  He  Has  Pain  And  Swelling  R=The  Airway is  intact

## 2015-03-24 NOTE — Discharge Instructions (Signed)
Your jaw appears to be in good alignment. I have refilled the pain medicine. Try the viscous lidocaine to help with the dental pain. You can alternate Tylenol and ibuprofen every 4 hours. Alternate ice and heat to the jaw to help with the pain. Follow-up with your surgeon and dentist as scheduled next week.
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
## 2015-03-29 ENCOUNTER — Encounter (HOSPITAL_COMMUNITY): Payer: Self-pay | Admitting: *Deleted

## 2015-03-29 ENCOUNTER — Emergency Department (HOSPITAL_COMMUNITY)
Admission: EM | Admit: 2015-03-29 | Discharge: 2015-03-29 | Disposition: A | Discharge status: Home / Self Care | Payer: BC Managed Care – PPO | Attending: Emergency Medicine | Admitting: Emergency Medicine

## 2015-03-29 DIAGNOSIS — Z8719 Personal history of other diseases of the digestive system: Secondary | ICD-10-CM | POA: Insufficient documentation

## 2015-03-29 DIAGNOSIS — Z9889 Other specified postprocedural states: Secondary | ICD-10-CM | POA: Insufficient documentation

## 2015-03-29 DIAGNOSIS — G40909 Epilepsy, unspecified, not intractable, without status epilepticus: Secondary | ICD-10-CM | POA: Diagnosis not present

## 2015-03-29 DIAGNOSIS — Z8679 Personal history of other diseases of the circulatory system: Secondary | ICD-10-CM | POA: Diagnosis not present

## 2015-03-29 DIAGNOSIS — R6884 Jaw pain: Secondary | ICD-10-CM | POA: Diagnosis present

## 2015-03-29 DIAGNOSIS — Z8639 Personal history of other endocrine, nutritional and metabolic disease: Secondary | ICD-10-CM | POA: Diagnosis not present

## 2015-03-29 DIAGNOSIS — F419 Anxiety disorder, unspecified: Secondary | ICD-10-CM | POA: Diagnosis not present

## 2015-03-29 DIAGNOSIS — Z79899 Other long term (current) drug therapy: Secondary | ICD-10-CM | POA: Insufficient documentation

## 2015-03-29 MED ORDER — HYDROMORPHONE HCL 1 MG/ML IJ SOLN
1.0000 mg | Freq: Once | INTRAMUSCULAR | Status: AC
  Administered 2015-03-29: 1 mg via INTRAMUSCULAR
  Filled 2015-03-29: qty 1

## 2015-03-29 NOTE — Discharge Instructions (Signed)
You need to follow-up with your surgeon from your previous surgery.  It was was [facility] that you have received a total of 240 Percocet over the last 2 weeks (11/14 and 11/17) along with the oxycodone 15 mg [facility] on 11/22.  With this amount of pain medication by law, I cannot write you any further medications.

## 2015-03-29 NOTE — ED Notes (Signed)
Pt c/o  R jaw pain onset since sx last week with worsening pain x 2 days, pt called surgeon & was advised to come here for further eval, pt A&O x4, follows commands, speaks in complete sentences
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
## 2015-04-05 ENCOUNTER — Encounter (HOSPITAL_COMMUNITY): Payer: Self-pay | Admitting: *Deleted

## 2015-04-05 ENCOUNTER — Emergency Department (HOSPITAL_COMMUNITY): Payer: BC Managed Care – PPO

## 2015-04-05 ENCOUNTER — Emergency Department (HOSPITAL_COMMUNITY)
Admission: EM | Admit: 2015-04-05 | Discharge: 2015-04-05 | Disposition: A | Discharge status: Home / Self Care | Payer: BC Managed Care – PPO | Attending: Emergency Medicine | Admitting: Emergency Medicine

## 2015-04-05 DIAGNOSIS — Y9389 Activity, other specified: Secondary | ICD-10-CM | POA: Diagnosis not present

## 2015-04-05 DIAGNOSIS — Z79899 Other long term (current) drug therapy: Secondary | ICD-10-CM | POA: Diagnosis not present

## 2015-04-05 DIAGNOSIS — Y998 Other external cause status: Secondary | ICD-10-CM | POA: Insufficient documentation

## 2015-04-05 DIAGNOSIS — S0993XA Unspecified injury of face, initial encounter: Secondary | ICD-10-CM | POA: Diagnosis present

## 2015-04-05 DIAGNOSIS — Y9289 Other specified places as the place of occurrence of the external cause: Secondary | ICD-10-CM | POA: Insufficient documentation

## 2015-04-05 DIAGNOSIS — Z8719 Personal history of other diseases of the digestive system: Secondary | ICD-10-CM | POA: Insufficient documentation

## 2015-04-05 DIAGNOSIS — S0301XA Dislocation of jaw, right side, initial encounter: Secondary | ICD-10-CM | POA: Diagnosis not present

## 2015-04-05 DIAGNOSIS — Z8679 Personal history of other diseases of the circulatory system: Secondary | ICD-10-CM | POA: Insufficient documentation

## 2015-04-05 DIAGNOSIS — W500XXA Accidental hit or strike by another person, initial encounter: Secondary | ICD-10-CM | POA: Insufficient documentation

## 2015-04-05 DIAGNOSIS — Z8639 Personal history of other endocrine, nutritional and metabolic disease: Secondary | ICD-10-CM | POA: Diagnosis not present

## 2015-04-05 DIAGNOSIS — S0300XA Dislocation of jaw, unspecified side, initial encounter: Secondary | ICD-10-CM

## 2015-04-05 DIAGNOSIS — F419 Anxiety disorder, unspecified: Secondary | ICD-10-CM | POA: Diagnosis not present

## 2015-04-05 DIAGNOSIS — R6884 Jaw pain: Secondary | ICD-10-CM

## 2015-04-05 LAB — I-STAT CHEM 8, ED
BUN: 9 mg/dL (ref 6–20)
CALCIUM ION: 1.19 mmol/L (ref 1.12–1.23)
CHLORIDE: 102 mmol/L (ref 101–111)
CREATININE: 0.8 mg/dL (ref 0.61–1.24)
GLUCOSE: 105 mg/dL — AB (ref 65–99)
HCT: 48 % (ref 39.0–52.0)
Hemoglobin: 16.3 g/dL (ref 13.0–17.0)
POTASSIUM: 3.9 mmol/L (ref 3.5–5.1)
Sodium: 141 mmol/L (ref 135–145)
TCO2: 25 mmol/L (ref 0–100)

## 2015-04-05 MED ORDER — HYDROMORPHONE HCL 1 MG/ML IJ SOLN
1.0000 mg | Freq: Once | INTRAMUSCULAR | Status: AC
  Administered 2015-04-05: 1 mg via INTRAVENOUS
  Filled 2015-04-05: qty 1

## 2015-04-05 MED ORDER — IOHEXOL 300 MG/ML  SOLN
75.0000 mL | Freq: Once | INTRAMUSCULAR | Status: AC | PRN
  Administered 2015-04-05: 75 mL via INTRAVENOUS

## 2015-04-05 MED ORDER — KETOROLAC TROMETHAMINE 60 MG/2ML IM SOLN
60.0000 mg | Freq: Once | INTRAMUSCULAR | Status: DC
  Filled 2015-04-05: qty 2

## 2015-04-05 MED ORDER — HYDROMORPHONE HCL 1 MG/ML IJ SOLN
1.0000 mg | Freq: Once | INTRAMUSCULAR | Status: AC
Start: 2015-04-05 — End: 2015-04-05
  Administered 2015-04-05: 1 mg via INTRAVENOUS
  Filled 2015-04-05: qty 1
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
## 2015-04-05 NOTE — Discharge Instructions (Signed)
You were evaluated in the ED today for your jaw pain. UR found to have a dislocated jaw. It is important for you to follow-up with your surgeon tomorrow, in the morning at 8 AM. Please call his office at [phone]. Please continue to have a soft diet, drink clear fluids. Return to ED for any new or worsening symptoms.  Jaw Dislocation A jaw dislocation is the displacement of the joint where the upper jaw bone (maxilla) and the lower jaw bone (mandibula) meet (temporomandibular joint). Soon after the dislocation, the jaw muscles tighten. This prevents the mouth from closing normally.  CAUSES A jaw dislocation usually is caused by a sudden forceful impact to the jaw. A strong punch in the jaw during a fist fight or a sports injury are examples of causes of jaw dislocation. Another cause is injury due to car or motorcycle accidents. RISK FACTORS Although anyone can have a jaw dislocation, some people are more at risk than others. People at increased risk for jaw dislocation include participants in contact sports. SYMPTOMS Symptoms of jaw dislocation can vary, depending on the severity of the dislocation. They can include:  Feeling that your teeth are out of alignment when you bite.  Inability to close your mouth completely.  Drooling.  Extreme pain, with the inability to move your jaw. DIAGNOSIS Your caregiver will feel your temporomandibular joints and ask you to move your jaw. Your caregiver also will feel the inside of your mouth to make sure there are no fractures or cuts (lacerations). TREATMENT Your caregiver will manipulate your jaw to put it back into place (reduction). If you have any jaw fractures from the dislocation, they usually will be held in place with plates and screws or with wiring.  HOME CARE INSTRUCTIONS The following measures can help to reduce pain and hasten the healing process:  Rest your injured joint. Do not move it. Avoid activities similar to the one that caused  your injury.  Apply ice to your injured joint for 1 to 2 days after your reduction or as directed by your caregiver. Applying ice helps to reduce inflammation and pain.  Put ice in a plastic bag.  Place a towel between your skin and the bag.  Leave the ice on for 15-20 minutes at a time, 03-04 times a day.  Take over-the-counter or prescription medication for pain as directed by your caregiver. Also, your caregiver may instruct you to only have certain foods until your jaw heals. These foods may be soft or liquified so that your jaw does not have to move much to eat them. SEEK IMMEDIATE MEDICAL CARE IF:  You have plates and screws or wiring to hold your jaw together that becomes loose or damaged.  You develop drainage from any of the cuts (incisions) where your wires or plates and screws were placed.  Your pain becomes worse rather than better. MAKE SURE YOU:  Understand these instructions.  Will watch your condition.  Will get help right away if you are not doing well or get worse.   This information is not intended to replace advice given to you by your health care provider. Make sure you discuss any questions you have with your health care provider.   Document Released: 04/15/2000 Document Revised: 07/11/2011 Document Reviewed: 09/15/2014 Elsevier Interactive Patient Education [employer]2016 Elsevier Inc.

## 2015-04-05 NOTE — Consult Note (Signed)
Pt called out after returning from CT scan complaining of increased pain at level of 7. Pt appears very distressed. Given an ice pack for jaw pain. PA made aware.

## 2015-04-05 NOTE — ED Notes (Signed)
Pt reports recent jaw surgery x 1 month ago. Now has severe pain, swelling and redness to right side of face. Reports severe right ear pain. Went to an ucc and sent here due to possible infection.
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
## 2015-04-05 NOTE — ED Notes (Signed)
Pt placed into gown and on monitor upon arrival to room. Pt monitored by blood pressure and pulse ox.  

## 2015-05-16 ENCOUNTER — Encounter (HOSPITAL_COMMUNITY): Payer: Self-pay | Admitting: Emergency Medicine

## 2015-05-16 ENCOUNTER — Emergency Department (INDEPENDENT_AMBULATORY_CARE_PROVIDER_SITE_OTHER)
Admission: EM | Admit: 2015-05-16 | Discharge: 2015-05-16 | Disposition: A | Discharge status: Home / Self Care | Payer: BC Managed Care – PPO | Source: Home / Self Care | Attending: Family Medicine | Admitting: Family Medicine

## 2015-05-16 DIAGNOSIS — S02609S Fracture of mandible, unspecified, sequela: Secondary | ICD-10-CM | POA: Diagnosis not present

## 2015-05-16 DIAGNOSIS — R6884 Jaw pain: Secondary | ICD-10-CM

## 2015-05-16 MED ORDER — OXYCODONE HCL 15 MG PO TABS: 15.0000 mg | ORAL_TABLET | Freq: Four times a day (QID) | ORAL | Status: DC | PRN

## 2015-05-16 NOTE — Discharge Instructions (Signed)
Wired Jaw Care You may have your jaw wired shut for many reasons, including a broken jaw or jaw surgery. The wires help hold your jaw in place while you heal. HOW TO CARE FOR YOUR WIRED JAW Keep your mouth clean.  Rinse your mouth with warm salt water after eating or drinking anything. To make salt water, mix  tsp of salt in one cup of warm water.   Brush the front of your teeth with a child-sized, soft toothbrush after you eat.  If you need to vomit, bend over and open your lips. Always rinse out your mouth and brush your teeth after vomiting. Take care of swelling.  Follow your health care provider's instructions about how to help the swelling go down.  Sit up or prop yourself up with pillows behind your back to help with swelling. Take care of pain and discomfort.  Do not drive or operate heavy machinery while taking pain medicine.  Use petroleum jelly on your lips to keep them from drying and cracking.  Cover the wire with dental wax if any wires are poking into your lips or gums.  Follow your health care provider's instructions.  Follow your health care provider's directions about what you can and cannot eat.  Take medicines only as directed by your health care provider.  Keep all follow-up visits as told by your health care provider. This is important. Only cut wires in an emergency.  Keep wire cutters with you at all times. Use them only in an emergency to cut the wires that hold your jaw together.  Do not cut the wires:  Even if you are tired of having your jaw wired.  Even if you are hungry.  Even if you need to vomit.  You may cut the wires that hold your jaw together only:  If you have trouble breathing.  If you are choking.  Do not cut the wires that connect to your back teeth (arch wires). If you must cut the wires in an emergency, cut straight across the wires that hold your mouth closed. These are the wires that are connected to the arch  wires. SEEK MEDICAL CARE IF:  You have a fever.  You feel nauseous or you vomit.  You feel that one or more wires have broken.  You have fluid, blood, or pus coming from your mouth or incisions.  You are dizzy. SEEK IMMEDIATE MEDICAL CARE IF:  You had to cut the wires that hold your jaw together.  Your pain is severe and is not helped with medicine.  You faint.   This information is not intended to replace advice given to you by your health care provider. Make sure you discuss any questions you have with your health care provider.   Document Released: 01/26/2008 Document Revised: 05/09/2014 Document Reviewed: 09/19/2013 Elsevier Interactive Patient Education [employer]2016 Elsevier Inc.

## 2015-05-16 NOTE — ED Notes (Signed)
[facility]. The patient stated that he has had 2 previous jaw surgeries and is now in extreme pain. The patient stated that he contacted his surgeon and they [facility] until he can be evaluated by the surgeon.

## 2015-05-16 NOTE — ED Provider Notes (Signed)
CSN: [id number]     Arrival date & time 05/16/15  1611 History   None    No chief complaint on file.  (Consider location/radiation/quality/duration/timing/severity/associated sxs/prior Treatment) The history is provided by the patient. No language interpreter was used.  Jaw pain:Here to follow up for his right jaw pain non stop since 3 months ago when he had surgery for broken jaw. He had a seizure then which caused the fracture of his jaw in 3 places. He has gotten 2 surgeirs done so far. He contacted his orofacial surg's office but he was advised to come to the urgent care for pain medicine refill pending f/u with them next week.   Past Medical History  Diagnosis Date  . Migraines   . Hypothyroidism   . Anxiety   . IBS (irritable bowel syndrome)   . Seizures [facility])    Past Surgical History  Procedure Laterality Date  . Eye muscle surgery Bilateral 1985  . Colonoscopy w/ biopsies    . Esophagogastroduodenoscopy    . Hip surgery    . Mandible surgery     Family History  Problem Relation Age of Onset  . Breast cancer Mother   . Pancreatic cancer Mother   . Diabetes Mother   . Heart disease Mother   . Prostate cancer Father   . Heart failure Maternal Grandmother    Social History  Substance Use Topics  . Smoking status: Never Smoker   . Smokeless tobacco: Never Used  . Alcohol Use: No    Review of Systems  HENT: Negative for ear discharge and ear pain.        Right jaw pain  Respiratory: Negative.   Cardiovascular: Negative.   Genitourinary: Negative.   All other systems reviewed and are negative.   Allergies  Nsaids and Tolmetin  Home Medications   Prior to Admission medications   Medication Sig Start Date End Date Taking? Authorizing Provider  ALPRAZolam Prudy Feeler) 1 MG tablet Take 1 mg by mouth 3 (three) times daily as needed for anxiety.  12/01/14   Historical Provider, MD  ibuprofen (ADVIL,MOTRIN) 200 MG tablet Take 800 mg by mouth every 8 (eight) hours as  needed for moderate pain.     Historical Provider, MD  levETIRAcetam (KEPPRA) 750 MG tablet Take 750 mg by mouth 2 (two) times daily.    Historical Provider, MD  oxyCODONE (ROXICODONE) 15 MG immediate release tablet Take 1 tablet (15 mg total) by mouth every 4 (four) hours as needed for pain. 03/24/15   Charm Rings, MD   Meds Ordered and Administered this Visit  Medications - No data to display  There were no vitals taken for this visit. No data found.   Physical Exam  Constitutional: He appears well-developed.  HENT:  Head:    Cardiovascular: Normal rate, regular rhythm and normal heart sounds.   No murmur heard. Pulmonary/Chest: Effort normal and breath sounds normal. No respiratory distress. He has no wheezes.  Nursing note and vitals reviewed.   ED Course  Procedures (including critical care time)  Labs Review Labs Reviewed - No data to display  Imaging Review No results found.   Visual Acuity Review  Right Eye Distance:   Left Eye Distance:   Bilateral Distance:    Right Eye Near:   Left Eye Near:    Bilateral Near:         MDM  No diagnosis found. Mandibular pain  Fracture of mandible, unspecified mandibular site, sequela (HCC)  I  reviewed his maxillofacial CT scan and his surgical note. He is s/p ORIF and jaw wiring. No acute change in symptoms. He needs pain control. I refilled his Oxycodone. He is also on Keppra to prevent seizure. He stated he has his Keppra at home and does not need refill for it. I recommended f/u with his surgeon. Return precaution discussed.  Doreene ElandKehinde T Gabryella Murfin, MD 05/16/15 [phone]

## 2015-05-20 ENCOUNTER — Encounter (HOSPITAL_COMMUNITY): Payer: Self-pay | Admitting: *Deleted

## 2015-05-20 ENCOUNTER — Emergency Department (INDEPENDENT_AMBULATORY_CARE_PROVIDER_SITE_OTHER)
Admission: EM | Admit: 2015-05-20 | Discharge: 2015-05-20 | Disposition: A | Discharge status: Home / Self Care | Payer: BC Managed Care – PPO | Source: Home / Self Care | Attending: Family Medicine | Admitting: Family Medicine

## 2015-05-20 DIAGNOSIS — K0889 Other specified disorders of teeth and supporting structures: Secondary | ICD-10-CM | POA: Diagnosis not present

## 2015-05-20 MED ORDER — HYDROCODONE-ACETAMINOPHEN 5-325 MG PO TABS: 1.0000 | ORAL_TABLET | Freq: Four times a day (QID) | ORAL | Status: DC | PRN

## 2015-05-20 NOTE — ED Notes (Signed)
Pt reports   Jaw pain     Pt  States   He     Is  Scheduled    For  followup  Surgery    On his  Jaw     He  States  He  Has  Dental  Pain         [facility]   He  Is  Requesting  Pain meds     He  Is  Sitting  Upright  On  exxam table  Speaking in  Complete  sentances
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
## 2015-05-20 NOTE — Discharge Instructions (Signed)
You must get dental referral locally . No more pain medicine can be prescribed from Urgent Care Center.

## 2015-06-21 ENCOUNTER — Encounter (HOSPITAL_COMMUNITY): Payer: Self-pay | Admitting: *Deleted

## 2015-06-21 ENCOUNTER — Emergency Department (INDEPENDENT_AMBULATORY_CARE_PROVIDER_SITE_OTHER)
Admission: EM | Admit: 2015-06-21 | Discharge: 2015-06-21 | Disposition: A | Discharge status: Home / Self Care | Payer: BC Managed Care – PPO | Source: Home / Self Care | Attending: Family Medicine | Admitting: Family Medicine

## 2015-06-21 DIAGNOSIS — R6884 Jaw pain: Secondary | ICD-10-CM

## 2015-06-21 HISTORY — DX: Fracture of mandible, unspecified, initial encounter for closed fracture: S02.609A

## 2015-06-21 NOTE — Discharge Instructions (Signed)
Keep taking ibuprofen 600 - 800 mg every 6 hours and extra strength tylenol as directed, but no more than  per day. Keep your appointment with the surgeon on Friday.
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
## 2015-06-21 NOTE — ED Notes (Signed)
Pt no longer in exam room.  Discharge instructions had been verbally reviewed per provider.

## 2015-06-21 NOTE — ED Provider Notes (Signed)
CSN: [id number]     Arrival date & time 06/21/15  1256 History   First MD Initiated Contact with Patient 06/21/15 1315     Chief Complaint  Patient presents with  . Jaw Pain   (Consider location/radiation/quality/duration/timing/severity/associated sxs/prior Treatment) HPI Gregory Bush is a 35 y.o. male returning for jaw pain.   His jaw pain originated after jaw fracture following a seizure in December, has since had multiple surgeries with ongoing pain. Surgeon was at [facility], now getting second opinion at [facility] in 5 days. No change in symptoms, just severe constant nonradiating right jaw pain worse with chewing/speaking better with oxycodone, specifically  dose. Of note, he has been seen here several times, and more recently told no further narcotic prescriptions would be issued.   Past Medical History  Diagnosis Date  . Migraines   . Hypothyroidism   . Anxiety   . IBS (irritable bowel syndrome)   . Seizures (HCC)   . Jaw fracture [facility])    Past Surgical History  Procedure Laterality Date  . Eye muscle surgery Bilateral 1985  . Colonoscopy w/ biopsies    . Esophagogastroduodenoscopy    . Hip surgery    . Mandible surgery     Family History  Problem Relation Age of Onset  . Breast cancer Mother   . Pancreatic cancer Mother   . Diabetes Mother   . Heart disease Mother   . Prostate cancer Father   . Heart failure Maternal Grandmother    Social History  Substance Use Topics  . Smoking status: Never Smoker   . Smokeless tobacco: Never Used  . Alcohol Use: No    Review of Systems  Allergies  Nsaids and Tolmetin  Home Medications   Prior to Admission medications   Medication Sig Start Date End Date Taking? Authorizing Provider  ibuprofen (ADVIL,MOTRIN) 200 MG tablet Take 800 mg by mouth every 8 (eight) hours as needed for moderate pain.    Yes Historical Provider, MD  levETIRAcetam (KEPPRA) 750 MG tablet Take 750 mg by mouth 2 (two) times daily.   Yes Historical  Provider, MD  ALPRAZolam Prudy Feeler) 1 MG tablet Take 1 mg by mouth 3 (three) times daily as needed for anxiety.  12/01/14   Historical Provider, MD  HYDROcodone-acetaminophen (NORCO/VICODIN) 5-325 MG tablet Take 1 tablet by mouth every 6 (six) hours as needed. For pain 05/20/15   Linna Hoff, MD  oxyCODONE (ROXICODONE) 15 MG immediate release tablet Take 1 tablet (15 mg total) by mouth every 6 (six) hours as needed for pain. 05/16/15   Doreene Eland, MD   Meds Ordered and Administered this Visit  Medications - No data to display  BP 155/96 mmHg  Pulse 107  Temp(Src) 98.2 F (36.8 C) (Oral)  Resp 16  SpO2 100% No data found.   Physical Exam  Well-appearing 35 yo male in no distress Tenderness to palpation along right mandible without erythema or swelling. Speaking clearly. Mucous membranes moist without erythema or dental attrition.   ED Course  Procedures (including critical care time)  Labs Review Labs Reviewed - No data to display  Imaging Review No results found.  MDM   1. Mandibular pain    No signs of infection or instability. Recommend continuing non-narcotic medications. Also denied his request for benzodiazepines for related anxiety.   This patient's case was discussed with the attending provider who examined the patient and helped formulate the plan of care.   Tyrone Nine, MD 06/21/15 [phone]

## 2015-06-21 NOTE — ED Notes (Signed)
Pt attempting to walk out of dept.  Requested pt to return to his room to await discharge papers - pt did so.

## 2015-10-02 ENCOUNTER — Encounter (HOSPITAL_COMMUNITY): Payer: Self-pay | Admitting: Emergency Medicine

## 2015-10-02 ENCOUNTER — Emergency Department (HOSPITAL_COMMUNITY)
Admission: EM | Admit: 2015-10-02 | Discharge: 2015-10-02 | Disposition: A | Discharge status: Home / Self Care | Payer: BC Managed Care – PPO | Attending: Emergency Medicine | Admitting: Emergency Medicine

## 2015-10-02 DIAGNOSIS — K029 Dental caries, unspecified: Secondary | ICD-10-CM | POA: Diagnosis not present

## 2015-10-02 DIAGNOSIS — Z9889 Other specified postprocedural states: Secondary | ICD-10-CM | POA: Diagnosis not present

## 2015-10-02 DIAGNOSIS — Z79891 Long term (current) use of opiate analgesic: Secondary | ICD-10-CM | POA: Diagnosis not present

## 2015-10-02 DIAGNOSIS — Z79899 Other long term (current) drug therapy: Secondary | ICD-10-CM | POA: Diagnosis not present

## 2015-10-02 DIAGNOSIS — Z791 Long term (current) use of non-steroidal anti-inflammatories (NSAID): Secondary | ICD-10-CM | POA: Insufficient documentation

## 2015-10-02 DIAGNOSIS — G8929 Other chronic pain: Secondary | ICD-10-CM

## 2015-10-02 DIAGNOSIS — R6884 Jaw pain: Secondary | ICD-10-CM | POA: Insufficient documentation

## 2015-10-02 DIAGNOSIS — E039 Hypothyroidism, unspecified: Secondary | ICD-10-CM | POA: Insufficient documentation

## 2015-10-02 MED ORDER — OXYCODONE-ACETAMINOPHEN 5-325 MG PO TABS
2.0000 | ORAL_TABLET | Freq: Once | ORAL | Status: AC
  Administered 2015-10-02: 2 via ORAL
  Filled 2015-10-02: qty 2

## 2015-10-02 NOTE — Discharge Instructions (Signed)
Please follow up closely with your oral surgeon for further management of your jaw pain.  Chronic Pain Chronic pain can be defined as pain that is off and on and lasts for 3-6 months or longer. Many things cause chronic pain, which can make it difficult to make a diagnosis. There are many treatment options available for chronic pain. However, finding a treatment that works well for you may require trying various approaches until the right one is found. Many people benefit from a combination of two or more types of treatment to control their pain. SYMPTOMS  Chronic pain can occur anywhere in the body and can range from mild to very severe. Some types of chronic pain include:  Headache.  Low back pain.  Cancer pain.  Arthritis pain.  Neurogenic pain. This is pain resulting from damage to nerves. People with chronic pain may also have other symptoms such as:  Depression.  Anger.  Insomnia.  Anxiety. DIAGNOSIS  Your health care provider will help diagnose your condition over time. In many cases, the initial focus will be on excluding possible conditions that could be causing the pain. Depending on your symptoms, your health care provider may order tests to diagnose your condition. Some of these tests may include:   Blood tests.   CT scan.   MRI.   X-rays.   Ultrasounds.   Nerve conduction studies.  You may need to see a specialist.  TREATMENT  Finding treatment that works well may take time. You may be referred to a pain specialist. He or she may prescribe medicine or therapies, such as:   Mindful meditation or yoga.  Shots (injections) of numbing or pain-relieving medicines into the spine or area of pain.  Local electrical stimulation.  Acupuncture.   Massage therapy.   Aroma, color, light, or sound therapy.   Biofeedback.   Working with a physical therapist to keep from getting stiff.   Regular, gentle exercise.   Cognitive or behavioral  therapy.   Group support.  Sometimes, surgery may be recommended.  HOME CARE INSTRUCTIONS   Take all medicines as directed by your health care provider.   Lessen stress in your life by relaxing and doing things such as listening to calming music.   Exercise or be active as directed by your health care provider.   Eat a healthy diet and include things such as vegetables, fruits, fish, and lean meats in your diet.   Keep all follow-up appointments with your health care provider.   Attend a support group with others suffering from chronic pain. SEEK MEDICAL CARE IF:   Your pain gets worse.   You develop a new pain that was not there before.   You cannot tolerate medicines given to you by your health care provider.   You have new symptoms since your last visit with your health care provider.  SEEK IMMEDIATE MEDICAL CARE IF:   You feel weak.   You have decreased sensation or numbness.   You lose control of bowel or bladder function.   Your pain suddenly gets much worse.   You develop shaking.  You develop chills.  You develop confusion.  You develop chest pain.  You develop shortness of breath.  MAKE SURE YOU:  Understand these instructions.  Will watch your condition.  Will get help right away if you are not doing well or get worse.   This information is not intended to replace advice given to you by your health care provider. Make sure  you discuss any questions you have with your health care provider.   Document Released: 01/08/2002 Document Revised: 12/19/2012 Document Reviewed: 10/12/2012 Elsevier Interactive Patient Education [employer]2016 [employer] Inc.

## 2015-10-02 NOTE — ED Notes (Signed)
Pt here with right sided jaw/dental pain since yesterday. Pt reports previous break with surgery to area.

## 2015-10-02 NOTE — ED Provider Notes (Signed)
CSN: [id number]     Arrival date & time 10/02/15  1319 History  By signing my name below, I, Placido SouLogan Joldersma, attest that this documentation has been prepared under the direction and in the presence of Fayrene HelperBowie Janyra Barillas, PA-C. Electronically Signed: Placido SouLogan Joldersma, ED Scribe. 10/02/2015. 2:06 PM.   Chief Complaint  Patient presents with  . Jaw Pain  . Dental Pain   The history is provided by the patient. No language interpreter was used.   HPI Comments: Gregory Bush is a 35 y.o. male who presents to the Emergency Department complaining of constant, moderate, sharp, right lower jaw pain x 1 day. He reports a hx of jaw fracture and multiple reconstructive surgeries to his jaw in September of 2016. He recently experienced a stomach virus with associated n/v/d noting that vomiting worsens his jaw pain. Pt was seen by his dental provider last week and d/c with abx and hydrocodone and is additionally taking 800 mg ibuprofen without significant relief. He has an appointment with a maxillofacial provider (Dr. Shea EvansBelcher) in 1 week. Pt denies current n/v/d, fever and chills.    Past Medical History  Diagnosis Date  . Migraines   . Hypothyroidism   . Anxiety   . IBS (irritable bowel syndrome)   . Seizures (HCC)   . Jaw fracture [facility](HCC)    Past Surgical History  Procedure Laterality Date  . Eye muscle surgery Bilateral 1985  . Colonoscopy w/ biopsies    . Esophagogastroduodenoscopy    . Hip surgery    . Mandible surgery     Family History  Problem Relation Age of Onset  . Breast cancer Mother   . Pancreatic cancer Mother   . Diabetes Mother   . Heart disease Mother   . Prostate cancer Father   . Heart failure Maternal Grandmother    Social History  Substance Use Topics  . Smoking status: Never Smoker   . Smokeless tobacco: Never Used  . Alcohol Use: No    Review of Systems  Constitutional: Negative for fever and chills.  HENT: Positive for dental problem.   Gastrointestinal: Negative for  nausea, vomiting and diarrhea.   Allergies  Nsaids and Tolmetin  Home Medications   Prior to Admission medications   Medication Sig Start Date End Date Taking? Authorizing Provider  ALPRAZolam Prudy Feeler(XANAX) 1 MG tablet Take 1 mg by mouth 3 (three) times daily as needed for anxiety.  12/01/14   Historical Provider, MD  HYDROcodone-acetaminophen (NORCO/VICODIN) 5-325 MG tablet Take 1 tablet by mouth every 6 (six) hours as needed. For pain 05/20/15   Linna HoffJames D Kindl, MD  ibuprofen (ADVIL,MOTRIN) 200 MG tablet Take 800 mg by mouth every 8 (eight) hours as needed for moderate pain.     Historical Provider, MD  levETIRAcetam (KEPPRA) 750 MG tablet Take 750 mg by mouth 2 (two) times daily.    Historical Provider, MD  oxyCODONE (ROXICODONE) 15 MG immediate release tablet Take 1 tablet (15 mg total) by mouth every 6 (six) hours as needed for pain. 05/16/15   Doreene ElandKehinde T Eniola, MD   BP 120/90 mmHg  Pulse 108  Temp(Src) 99 F (37.2 C) (Oral)  Resp 18  Ht 5\' 8"  (1.727 m)  Wt 164 lb 2 oz (74.447 kg)  BMI 24.96 kg/m2  SpO2 98%    Physical Exam  Constitutional: He is oriented to person, place, and time. He appears well-developed and well-nourished.  Caucasian male sitting upright and speaking in complete sentences  HENT:  Head: Normocephalic  and atraumatic.  Significant dental decay noted to right upper jaw with mild gingival erythema and TTP; decay most notable at the 1st premolar and the bicuspid with TTP; no malocclusion; mild trismus noted  Eyes: EOM are normal.  Neck: Normal range of motion.  Cardiovascular: Normal rate.   Pulmonary/Chest: Effort normal. No respiratory distress.  Abdominal: Soft.  Musculoskeletal: Normal range of motion.  Neurological: He is alert and oriented to person, place, and time.  Skin: Skin is warm and dry.  Psychiatric: He has a normal mood and affect.  Nursing note and vitals reviewed.   ED Course  Procedures  DIAGNOSTIC STUDIES: Oxygen Saturation is 98% on RA,  normal by my interpretation.    COORDINATION OF CARE: 2:03 PM Discussed next steps with pt. Pt provided one dose of percocet for pain management while in the ED. Pt was made aware of ED policies regarding rx of narcotic medications. Pt verbalized understanding and is agreeable with the plan.   MDM   Final diagnoses:  Chronic jaw pain    Patient with dentalgia.  No abscess requiring immediate incision and drainage.  Exam not concerning for Ludwig's angina or pharyngeal abscess.  Will treat with percocet in the ER setting only. Pt instructed to follow-up with oral surgeon previously scheduled.  Discussed return precautions. Pt safe for discharge.  BP 120/90 mmHg  Pulse 108  Temp(Src) 99 F (37.2 C) (Oral)  Resp 18  Ht  (1.727 m)  Wt 74.447 kg  BMI 24.96 kg/m2  SpO2 98% Tachycardia 2/2 pain  I personally performed the services described in this documentation, which was scribed in my presence. The recorded information has been reviewed and is accurate.      Fayrene Helper, PA-C 10/02/15 1407  Mancel Bale, MD 10/02/15 [phone]

## 2015-10-14 ENCOUNTER — Inpatient Hospital Stay
Admit: 2015-10-14 | Discharge: 2015-10-14 | Disposition: A | Discharge status: Home / Self Care | Payer: Self-pay | Attending: Emergency Medicine

## 2015-10-14 DIAGNOSIS — S161XXA Strain of muscle, fascia and tendon at neck level, initial encounter: Secondary | ICD-10-CM

## 2015-10-14 MED ORDER — CYCLOBENZAPRINE 10 MG TAB
10 mg | ORAL | Status: AC
Start: 2015-10-14 — End: 2015-10-14
  Administered 2015-10-14: 20:00:00 via ORAL

## 2015-10-14 MED FILL — CYCLOBENZAPRINE 10 MG TAB: 10 mg | ORAL | Qty: 1
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
## 2015-10-14 NOTE — ED Notes (Signed)
As per pt he does not want an xray he doesn't have health insurance. Pt sts he wanted to go home.
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
## 2015-10-14 NOTE — ED Notes (Signed)
Discharge order received written instructions provided patient verbalized understanding ambulated to exit

## 2015-12-30 ENCOUNTER — Inpatient Hospital Stay
Admit: 2015-12-30 | Discharge: 2015-12-31 | Disposition: A | Discharge status: Home / Self Care | Payer: Self-pay | Attending: Emergency Medicine

## 2015-12-30 DIAGNOSIS — R6884 Jaw pain: Secondary | ICD-10-CM

## 2015-12-30 NOTE — ED Provider Notes (Signed)
The history is provided by the patient.    7:42 PM: Bryan Palmer is a 35 y.o. male who presents to the Emergency Department complaining of severe jaw pain. Patient states he fractured his jaw 9 months ago and has had three surgeries to repair it but the jaw did not heal properly. Patient is scheduled for surgery in a week but ran out of pain medication. Patient requesting pain medication to last him over the next week. Patient is from [place] but states he filled a prescription here that was given to him two days ago at [place]. Patient denies any other medical problems or any allergies to medications. Patient denies smoking or drinking. No other complaints at this time.      History reviewed. No pertinent past medical history.    Past Surgical History:   Procedure Laterality Date   ??? HX ORTHOPAEDIC      jaw         History reviewed. No pertinent family history.    Social History     Social History   ??? Marital status: MARRIED     Spouse name: N/A   ??? Number of children: N/A   ??? Years of education: N/A     Occupational History   ??? Not on file.     Social History Main Topics   ??? Smoking status: Not on file   ??? Smokeless tobacco: Not on file   ??? Alcohol use Not on file   ??? Drug use: Not on file   ??? Sexual activity: Not on file     Other Topics Concern   ??? Not on file     Social History Narrative         ALLERGIES: Review of patient's allergies indicates no known allergies.    Review of Systems   Constitutional: Negative.  Negative for fever.   HENT: Negative for congestion, rhinorrhea and sore throat.         Positive for jaw pain.   Eyes: Negative.  Negative for visual disturbance.   Respiratory: Negative.  Negative for cough and shortness of breath.    Cardiovascular: Negative for chest pain.   Gastrointestinal: Negative for abdominal pain, blood in stool, constipation, diarrhea, nausea and vomiting.   Endocrine: Negative.  Negative for polyuria.    Genitourinary: Negative.  Negative for difficulty urinating, dysuria, frequency and hematuria.   Musculoskeletal: Negative.    Neurological: Negative.  Negative for weakness, numbness and headaches.   All other systems reviewed and are negative.      Vitals:    12/30/15 1850   BP: 118/80   Pulse: 95   Resp: 14   Temp: 99 ??F (37.2 ??C)   SpO2: 100%   Weight: 74.8 kg (165 lb)   Height: 5\' 8"  (1.727 m)   Pulse oximetry at bedside is 100%, demonstrating no clinically significant hypoxia.         Physical Exam   Constitutional: He is oriented to person, place, and time. He appears well-developed and well-nourished.   HENT:   Head: Normocephalic and atraumatic.   Eyes: EOM are normal. Pupils are equal, round, and reactive to light. No scleral icterus.   Neck: Neck supple. No JVD present.   Pulmonary/Chest: Effort normal. No respiratory distress.   Neurological: He is alert and oriented to person, place, and time.   Skin: Skin is warm and dry.   Psychiatric: He has a normal mood and affect.   Nursing note and vitals  reviewed.       MDM  Number of Diagnoses or Management Options  Jaw pain:   Diagnosis management comments: My initial impression is that of breakthrough pain secondary to complex mandible injury. Patient is demanding opiate pain medication. I [place]. The patient is receiving multiple narcotic prescriptions from a provider that should not have run out thus far. When I confronted the patient with this information, he began to attempt to explain the discrepancy but there were multiple inconsistencies in his story. I expressed my concern for chronic pain syndrome and worsening opiate dependence to the patient. He was offered the entire range of non-narcotic pain medication and I gave him one dose of his prescribed pain medication by mouth. I also informed him that if he continues to have chronic pain issues then he needs to follow up with a  pain management specialist.            Amount and/or Complexity of Data Reviewed  Decide to obtain previous medical records or to obtain history from someone other than the patient: yes  Review and summarize past medical records: yes  Independent visualization of images, tracings, or specimens: yes      ED Course       Procedures      I, Erasmo Downerharles A Kao Conry, MD, reviewed the patient's past history, allergies and home medications as documented in the nursing chart.      <EMERGENCY DEPARTMENT CASE SUMMARY>    ED Course:     8:06 PM My impression is that of breakthrough pain secondary to complex mandible injury. Patient reassessed at bedside by me. Feels better at present, wants to go home. Patient was told to follow up with the primary doctor in 1-2 days and return to the ED for any worsening severe pain, vomiting, fever, or any other concerns. Family/friend was present for the conversation. Patient and family members verbalized understanding of the discharge instructions and had no further questions at this time.    Final Impression/Diagnosis:   Encounter Diagnoses     ICD-10-CM ICD-9-CM   1. Jaw pain R68.84 784.92       Patient condition at time of disposition: Stable      I have reviewed the following home medications:    Prior to Admission medications    Medication Sig Start Date End Date Taking? Authorizing Provider   pantoprazole (PROTONIX) 20 mg tablet Take 20 mg by mouth daily.    Phys Other, MD   oxyCODONE-acetaminophen (PERCOCET) 5-325 mg per tablet Take 1 Tab by mouth every four (4) hours as needed for Pain (for recent jaw surgery).    Phys Other, MD       Erasmo Downerharles A Tilia Faso, MD      I, Daylene Katayamahristopher Aspromonte, am serving as a scribe to document services personally performed by Erasmo Downerharles A Gita Dilger, MD based on my observation and the provider's statements to me.    I, Erasmo Downerharles A Marian Grandt, MD, attest that the person(s) noted above, acting  as my scribe(s) noted above, has observed my performance of the services and has documented them in accordance with my direction. I have personally reviewed the above information and have ordered and reviewed the diagnostic studies, unless otherwise noted.

## 2015-12-30 NOTE — ED Triage Notes (Signed)
Hx of jaw fracture. Pt has had 3 different surgeries in relation to fx. Last sx was 6 months ago. Pt presents with severe jaw pain

## 2015-12-30 NOTE — ED Notes (Signed)
Waiting for pharm to bring medication.

## 2015-12-31 MED ORDER — OXYCODONE ER 20 MG TABLET,CRUSH RESISTANT,EXTENDED RELEASE 12 HR
20 mg | Freq: Two times a day (BID) | ORAL | Status: DC
Start: 2015-12-31 — End: 2015-12-31
  Administered 2015-12-31: 01:00:00 via ORAL

## 2016-01-23 ENCOUNTER — Inpatient Hospital Stay
Admit: 2016-01-23 | Discharge: 2016-01-24 | Disposition: A | Discharge status: Home / Self Care | Payer: BLUE CROSS/BLUE SHIELD | Attending: Emergency Medicine

## 2016-01-23 DIAGNOSIS — R6884 Jaw pain: Secondary | ICD-10-CM

## 2016-01-23 NOTE — ED Provider Notes (Addendum)
The history is provided by the patient.      8:40 PM: Bryan Palmer is a 35 y.o. male with history of jaw orthopaedic who presents to the Emergency Department complaining of right jaw pain. Patient states his pain is worse than his chronic jaw pain. Patient reports there may be some swelling in his jaw. Patient denies following up with his PMD in the last couple days prior to coming to the ED. Patient requests Percocet.      History reviewed. No pertinent past medical history.    Past Surgical History:   Procedure Laterality Date   ??? HX ORTHOPAEDIC      jaw         History reviewed. No pertinent family history.    Social History     Social History   ??? Marital status: MARRIED     Spouse name: N/A   ??? Number of children: N/A   ??? Years of education: N/A     Occupational History   ??? Not on file.     Social History Main Topics   ??? Smoking status: Never Smoker   ??? Smokeless tobacco: Not on file   ??? Alcohol use No   ??? Drug use: No   ??? Sexual activity: Not on file     Other Topics Concern   ??? Not on file     Social History Narrative         ALLERGIES: Review of patient's allergies indicates no known allergies.    Review of Systems   HENT: Negative.  Negative for congestion, dental problem, drooling, ear discharge, ear pain, hearing loss, mouth sores, nosebleeds, postnasal drip, rhinorrhea, sinus pain, sinus pressure, sneezing, sore throat, tinnitus, trouble swallowing and voice change. Facial swelling: subjective.    Respiratory: Negative.    Cardiovascular: Negative.    Gastrointestinal: Negative.    Musculoskeletal: Negative for neck pain and neck stiffness.        Right jaw pain   Hematological: Negative.    Psychiatric/Behavioral: Negative.        Vitals:    01/23/16 2015   BP: 129/79   Pulse: (!) 126   Resp: 16   Temp: 98.3 ??F (36.8 ??C)   SpO2: 99%   Weight: 72.6 kg (160 lb)   Height: 5\' 8"  (1.727 m)          8:40 PM Pulse Oximetry reading is 99 % on room air, which indicates normal  oxygenation per Earnie LarssonPriti Dawt Reeb V, MD.     Physical Exam   Constitutional: He is oriented to person, place, and time. He appears well-developed and well-nourished.   HENT:   Head: Normocephalic and atraumatic.   Right Ear: External ear normal.   Left Ear: External ear normal.   Nose: Nose normal.   Mouth/Throat: Oropharynx is clear and moist. No oropharyngeal exudate.   Eyes: Conjunctivae and EOM are normal. Pupils are equal, round, and reactive to light. Right eye exhibits no discharge. Left eye exhibits no discharge. No scleral icterus.   Neck: Normal range of motion. Neck supple.   Cardiovascular: Normal rate.    Pulmonary/Chest: Effort normal and breath sounds normal. No respiratory distress. He has no wheezes. He has no rales. He exhibits no tenderness.   Musculoskeletal: Normal range of motion. He exhibits no edema, tenderness or deformity.   Neurological: He is alert and oriented to person, place, and time.   Skin: Skin is warm and dry.   Nursing note and vitals reviewed.  MDM  Number of Diagnoses or Management Options  Chronic jaw pain:      Amount and/or Complexity of Data Reviewed  Obtain history from someone other than the patient: yes ([place] PMP)  Review and summarize past medical records: yes  Independent visualization of images, tracings, or specimens: yes    Patient Progress  Patient progress: stable    ED Course       Procedures    I, Joaquim Nam, NP, reviewed the patient's past history, allergies and home medications as documented in the nursing chart.      <EMERGENCY DEPARTMENT CASE SUMMARY>    ED Course:     Patient presents to the ED complaining of worsening of right jaw pain. Patient requests Percocet. However, patient has filled multiple scripts in the last several weeks. Patient was offered Toradol and evaluation to rule out infection of the jaw. He declined and left the ED prior to receiving ER discharge papers. Patient was encouraged to return for fever and worsening symptoms.     The patient is voluntarily refusing any further evaluation and treatment in the ER for Her right jaw pain complaint.  The patient is requesting to leave against medical advice.  I discussed the benefits of staying for further evaluation and the risks of leaving against medical advice with the patient.  Despite my best efforts to convince the patient to stay for further workup He refused.  I discussed specific risks including but not limited to death, permanent impairment, worsened condition, and need for prolonged hospitalization associated with delayed diagnosis and/or treatment. I have given the patient an opportunity to ask questions, and have answered those questions to His satisfaction given the information available.  The patient understands the risks of leaving at this time and verbalizes this understanding. He is medically competent to make His own medical decisions.  I informed Him that He can return to the ER at any time and, in fact, should return if his symptoms worsen or He changes His mind.   I also informed Himto see His primary care doctor and specialist as soon as possible for further evaluation.    Final Impression/Diagnosis:   Encounter Diagnoses     ICD-10-CM ICD-9-CM   1. Chronic jaw pain R68.84 784.92    G89.29 338.29     Patient condition at time of disposition: stable      I have reviewed the following home medications:    Prior to Admission medications    Medication Sig Start Date End Date Taking? Authorizing Provider   ibuprofen (ADVIL) 200 mg tablet Take 800 mg by mouth every eight (8) hours as needed for Pain.   Yes Phys Other, MD   oxyCODONE IR (ROXICODONE) 30 mg immediate release tablet Take 30 mg by mouth every four (4) hours as needed for Pain.   Yes Phys Other, MD   acetaminophen (TYLENOL EXTRA STRENGTH) 500 mg tablet Take 1,000 mg by mouth every six (6) hours as needed for Pain.   Yes Phys Other, MD       Joaquim Nam, NP      I, Toula Moos, am serving as a scribe to document services personally performed by Joaquim Nam, NP based on my observation and the provider's statements to me.    Sterling Big, NP, attest that the person(s) noted above, acting as my scribe(s) noted above, has observed my performance of the services and has documented them in accordance with my direction. I have  personally reviewed the above information and have ordered and reviewed the diagnostic studies, unless otherwise noted.    I was personally available for consultation in the emergency department.  I have reviewed the chart and agree with the documentation recorded by the [facility], including the assessment, treatment plan, and disposition.  Earnie Larsson, MD

## 2016-01-23 NOTE — ED Notes (Addendum)
Pt declines to be treated without narcotics. Leaves ER against medical advice without paperwork.

## 2016-01-23 NOTE — ED Triage Notes (Addendum)
Has right sided jaw pain. He broke his jaw 9 months ago and had surgical repair. He said it started hurting this am and feels swollen. He states he was here recently for increased pain but was treated and released. Patient has taken his pain meds today

## 2016-01-24 MED ORDER — KETOROLAC TROMETHAMINE 60 MG/2 ML IM
60 mg/2 mL | Freq: Once | INTRAMUSCULAR | Status: DC
Start: 2016-01-24 — End: 2016-01-24

## 2016-01-24 MED FILL — KETOROLAC TROMETHAMINE 60 MG/2 ML IM: 60 mg/2 mL | INTRAMUSCULAR | Qty: 2

## 2016-02-27 DIAGNOSIS — R6884 Jaw pain: Secondary | ICD-10-CM

## 2016-02-27 NOTE — ED Provider Notes (Addendum)
HPI Comments: Pt with h/o jaw surgery c/o pain and swelling on right side of jaw.  P/s he had surgery in [place], getting a second opinion here.      Patient is a 35 y.o. male presenting with jaw pain. The history is provided by the patient.   Jaw Pain    The incident occurred more than 1 week ago. He came to the ER via walk-in.        Past Medical History:   Diagnosis Date   ??? Anxiety    ??? Chronic pain     jaw   ??? Seizures (HCC)        Past Surgical History:   Procedure Laterality Date   ??? HX ORTHOPAEDIC      jaw         History reviewed. No pertinent family history.    Social History     Social History   ??? Marital status: MARRIED     Spouse name: N/A   ??? Number of children: N/A   ??? Years of education: N/A     Occupational History   ??? Not on file.     Social History Main Topics   ??? Smoking status: Never Smoker   ??? Smokeless tobacco: Never Used   ??? Alcohol use No   ??? Drug use: No   ??? Sexual activity: Not on file     Other Topics Concern   ??? Not on file     Social History Narrative         ALLERGIES: Review of patient's allergies indicates no known allergies.    Review of Systems   Constitutional: Negative.    HENT: Positive for dental problem and facial swelling.    Respiratory: Negative.    Cardiovascular: Negative.    Gastrointestinal: Negative.    Genitourinary: Negative.    Musculoskeletal: Negative.    Skin: Negative.        Vitals:    02/27/16 2127   BP: (!) 148/97   Pulse: 96   Resp: 20   Temp: 98.6 ??F (37 ??C)   SpO2: 100%   Weight: 72.6 kg (160 lb)   Height: 5\' 8"  (1.727 m)            Physical Exam   Constitutional: He is oriented to person, place, and time. He appears well-developed and well-nourished.   HENT:   Head: Normocephalic.   Mouth/Throat: Oropharynx is clear and moist. Abnormal dentition. Dental caries present. No dental abscesses.   + tender rt lower gum line, no abscesses   Eyes: Conjunctivae are normal. Pupils are equal, round, and reactive to light.   Neck: Normal range of motion.    Cardiovascular: Normal rate.    Pulmonary/Chest: Effort normal.   Musculoskeletal: Normal range of motion.   Neurological: He is alert and oriented to person, place, and time.   Nursing note and vitals reviewed.       MDM  Number of Diagnoses or Management Options  Chronic jaw pain:      Amount and/or Complexity of Data Reviewed  Discussion of test results with the performing providers: yes  Obtain history from someone other than the patient: yes  Discuss the patient with other providers: yes      ED Course       Procedures      <EMERGENCY DEPARTMENT CASE SUMMARY>    Impression/Differential Diagnosis: jaw pain    Plan: exam    ED Course: pt given clindamycin  Explained there is no indication to do xray or CT scan  I-STOP indicates pt has received large #'s of narcotics form multiple sources in multiple states under multiple addresses  rx clindamycin    Final Impression/Diagnosis: chronic pain, possible early gum infection    Patient condition at time of disposition: stable      I have reviewed the following home medications:    Prior to Admission medications    Medication Sig Start Date End Date Taking? Authorizing Provider   clonazePAM (KLONOPIN) 1 mg tablet Take  by mouth two (2) times a day.   Yes Phys Other, MD   clindamycin (CLEOCIN) 300 mg capsule Take 1 Cap by mouth four (4) times daily for 7 days. 02/27/16 03/05/16 Yes Bertram DenverKarleen Koenig, PA   ibuprofen (ADVIL) 200 mg tablet Take 800 mg by mouth every eight (8) hours as needed for Pain.   Yes Phys Other, MD   oxyCODONE IR (ROXICODONE) 30 mg immediate release tablet Take 20 mg by mouth every four (4) hours as needed for Pain.   Yes Phys Other, MD   acetaminophen (TYLENOL EXTRA STRENGTH) 500 mg tablet Take 1,000 mg by mouth every six (6) hours as needed for Pain.   Yes Phys Other, MD         Bertram DenverKarleen Koenig, PA       I was personally available for consultation in the emergency department. I have reviewed the chart and agree with the documentation recorded by the  midlevel provider, including the assessment, treatment plan, and disposition.    Scot JunStephen Zahra Peffley, MD

## 2016-02-27 NOTE — ED Triage Notes (Signed)
Pt sts hx of broken jaw with plates and screws in. sts today noted pain and swelling in right side of jaw.

## 2016-02-27 NOTE — ED Notes (Signed)
The patient asked for a second opinion. Dr Rica KoyanagiSabo into see patient

## 2016-02-27 NOTE — ED Notes (Signed)
Pt given discharge instructions, Verbalizes understanding. Vital signs stable. Pt sent home with RX. Pt discharged with  self

## 2016-02-28 ENCOUNTER — Inpatient Hospital Stay
Admit: 2016-02-28 | Discharge: 2016-02-28 | Disposition: A | Discharge status: Home / Self Care | Payer: BLUE CROSS/BLUE SHIELD | Attending: Emergency Medicine

## 2016-02-28 MED ORDER — CLINDAMYCIN 300 MG CAP
300 mg | ORAL_CAPSULE | Freq: Four times a day (QID) | ORAL | 0 refills | Status: AC
Start: 2016-02-28 — End: 2016-03-05

## 2016-02-28 MED ORDER — CLINDAMYCIN 150 MG CAP
150 mg | ORAL | Status: AC
Start: 2016-02-28 — End: 2016-02-27
  Administered 2016-02-28: 02:00:00 via ORAL

## 2016-02-28 MED FILL — CLINDAMYCIN 150 MG CAP: 150 mg | ORAL | Qty: 2

## 2016-04-30 ENCOUNTER — Encounter (HOSPITAL_COMMUNITY): Payer: Self-pay | Admitting: Emergency Medicine

## 2016-04-30 ENCOUNTER — Emergency Department (HOSPITAL_COMMUNITY)
Admission: EM | Admit: 2016-04-30 | Discharge: 2016-04-30 | Disposition: A | Discharge status: Home / Self Care | Payer: BC Managed Care – PPO | Attending: Dermatology | Admitting: Dermatology

## 2016-04-30 DIAGNOSIS — Z5321 Procedure and treatment not carried out due to patient leaving prior to being seen by health care provider: Secondary | ICD-10-CM | POA: Diagnosis not present

## 2016-04-30 DIAGNOSIS — E039 Hypothyroidism, unspecified: Secondary | ICD-10-CM | POA: Diagnosis not present

## 2016-04-30 DIAGNOSIS — K0889 Other specified disorders of teeth and supporting structures: Secondary | ICD-10-CM | POA: Diagnosis present

## 2016-04-30 NOTE — ED Triage Notes (Signed)
Patient accidentally hit his left upper incisor with a barbell while working out last night presents with dental pain .

## 2016-04-30 NOTE — ED Notes (Signed)
Pt stated he cannot wait, Pt encouraged to stay. Pt seen walking out the lobby.

## 2016-06-11 ENCOUNTER — Encounter (HOSPITAL_COMMUNITY): Payer: Self-pay | Admitting: Nurse Practitioner

## 2016-06-11 ENCOUNTER — Emergency Department (HOSPITAL_COMMUNITY): Payer: Self-pay

## 2016-06-11 ENCOUNTER — Emergency Department (HOSPITAL_COMMUNITY)
Admission: EM | Admit: 2016-06-11 | Discharge: 2016-06-11 | Disposition: A | Discharge status: Home / Self Care | Payer: Self-pay | Attending: Emergency Medicine | Admitting: Emergency Medicine

## 2016-06-11 DIAGNOSIS — M533 Sacrococcygeal disorders, not elsewhere classified: Secondary | ICD-10-CM | POA: Insufficient documentation

## 2016-06-11 DIAGNOSIS — R197 Diarrhea, unspecified: Secondary | ICD-10-CM

## 2016-06-11 DIAGNOSIS — K529 Noninfective gastroenteritis and colitis, unspecified: Secondary | ICD-10-CM | POA: Insufficient documentation

## 2016-06-11 DIAGNOSIS — E039 Hypothyroidism, unspecified: Secondary | ICD-10-CM | POA: Insufficient documentation

## 2016-06-11 LAB — URINALYSIS, ROUTINE W REFLEX MICROSCOPIC
BILIRUBIN URINE: NEGATIVE
Bacteria, UA: NONE SEEN
Glucose, UA: NEGATIVE mg/dL
KETONES UR: NEGATIVE mg/dL
Leukocytes, UA: NEGATIVE
Nitrite: NEGATIVE
PROTEIN: NEGATIVE mg/dL
SPECIFIC GRAVITY, URINE: 1.025 (ref 1.005–1.030)
pH: 5 (ref 5.0–8.0)

## 2016-06-11 LAB — CBC
HCT: 43.8 % (ref 39.0–52.0)
Hemoglobin: 14.8 g/dL (ref 13.0–17.0)
MCH: 29.5 pg (ref 26.0–34.0)
MCHC: 33.8 g/dL (ref 30.0–36.0)
MCV: 87.4 fL (ref 78.0–100.0)
PLATELETS: 246 10*3/uL (ref 150–400)
RBC: 5.01 MIL/uL (ref 4.22–5.81)
RDW: 14.4 % (ref 11.5–15.5)
WBC: 12.4 10*3/uL — ABNORMAL HIGH (ref 4.0–10.5)

## 2016-06-11 LAB — COMPREHENSIVE METABOLIC PANEL
ALBUMIN: 4.4 g/dL (ref 3.5–5.0)
ALT: 16 U/L — AB (ref 17–63)
AST: 16 U/L (ref 15–41)
Alkaline Phosphatase: 71 U/L (ref 38–126)
Anion gap: 9 (ref 5–15)
BUN: 9 mg/dL (ref 6–20)
CHLORIDE: 104 mmol/L (ref 101–111)
CO2: 24 mmol/L (ref 22–32)
CREATININE: 1.02 mg/dL (ref 0.61–1.24)
Calcium: 9.7 mg/dL (ref 8.9–10.3)
GFR calc non Af Amer: >60 mL/min (ref >60)
Glucose, Bld: 104 mg/dL — ABNORMAL HIGH (ref 65–99)
Potassium: 3.9 mmol/L (ref 3.5–5.1)
SODIUM: 137 mmol/L (ref 135–145)
Total Bilirubin: 0.7 mg/dL (ref 0.3–1.2)
Total Protein: 8 g/dL (ref 6.5–8.1)

## 2016-06-11 MED ORDER — OXYCODONE-ACETAMINOPHEN 5-325 MG PO TABS: 1.0000 | ORAL_TABLET | Freq: Once | ORAL | Status: DC

## 2016-06-11 MED ORDER — ACETAMINOPHEN 325 MG PO TABS
650.0000 mg | ORAL_TABLET | Freq: Once | ORAL | Status: AC
  Administered 2016-06-11: 650 mg via ORAL
  Filled 2016-06-11: qty 2

## 2016-06-11 MED ORDER — OXYCODONE-ACETAMINOPHEN 5-325 MG PO TABS: 2.0000 | ORAL_TABLET | Freq: Once | ORAL | Status: DC

## 2016-06-11 MED ORDER — DIAZEPAM 5 MG PO TABS: 5.0000 mg | ORAL_TABLET | Freq: Two times a day (BID) | ORAL | 0 refills | Status: DC

## 2016-06-11 NOTE — ED Triage Notes (Signed)
Pt presents with c/o lower back pain. The pain began 3 days ago after heavy lifting the day before. He's been vomiting since yesterday and cant keep any fluids down. He said this is normal for him during times of high stress. he's been very stressed and unable to sleep due to the back pain. He denies any history of back. He denies any fevers, numbness, tingling, bowel or bladder changes. He has tried heat, ice, advil, tylenol, oxycodone with temporary improvement in the pain.

## 2016-06-11 NOTE — Discharge Instructions (Signed)
Please use medication only as directed. Please follow-up with her primary care provider as soon as possible for reevaluation further management. Please follow-up with orthopedics if he continued to endorse back pain. Please return immediately if any new or worsening signs or symptoms present.

## 2016-06-11 NOTE — ED Provider Notes (Signed)
[facility]-EMERG[facility]Y DEPT Provider Note   CSN: [id number] Arrival date & time: 06/11/16  1532     History   Chief Complaint Chief Complaint  Patient presents with  . Back Pain    HPI Gregory Bush is a 36 y.o. male.  HPI   36 year old male presents today with complaints of back pain. Patient reports that 3 days ago he was helping a friend at a [employer]. He notes the following day he started to develop lower back pain closed to his sacrum. He describes as sharp in nature, stabbing, worse with pressure, worse with sitting, worse with movements. Patient denies any loss of distal sensation strength or motor function, no bowel or bladder changes. Patient denies any abdominal discomfort. Notes that upon awakening today he had some nausea and vomiting and a small amount of diarrhea. Patient notes this happens when he takes Advil. He denies any fevers. He was prescribed oxycodone by primary care for ongoing shoulder pain and jaw pain, along with Valium. Patient notes he ran out of both of these 2 days ago.   Past Medical History:  Diagnosis Date  . Anxiety   . Hypothyroidism   . IBS (irritable bowel syndrome)   . Jaw fracture (HCC)   . Migraines   . Seizures [facility](HCC)     Patient Active Problem List   Diagnosis Date Noted  . AP (abdominal pain) 04/07/2014  . Abnormal CT scan 04/07/2014  . Loss of weight 04/07/2014  . ACUTE GASTRITIS WITHOUT MENTION OF HEMORRHAGE 11/06/2009  . DIARRHEA 10/30/2009  . UNSPECIFIED HYPOTHYROIDISM 10/29/2009  . HYPERCHOLESTEROLEMIA 10/29/2009  . ANXIETY 10/29/2009  . MICROSCOPIC HEMATURIA 10/29/2009    Past Surgical History:  Procedure Laterality Date  . COLONOSCOPY W/ BIOPSIES    . ESOPHAGOGASTRODUODENOSCOPY    . EYE MUSCLE SURGERY Bilateral 1985  . HIP SURGERY    . MANDIBLE SURGERY         Home Medications    Prior to Admission medications   Medication Sig Start Date End Date Taking? Authorizing Provider  ALPRAZolam Prudy Feeler(XANAX) 1 MG  tablet Take 1 mg by mouth 3 (three) times daily as needed for anxiety.  12/01/14   Historical Provider, MD  diazepam (VALIUM) 5 MG tablet Take 1 tablet (5 mg total) by mouth 2 (two) times daily. 06/11/16   Eyvonne MechanicJeffrey Maclin Guerrette, PA-C  HYDROcodone-acetaminophen (NORCO/VICODIN) 5-325 MG tablet Take 1 tablet by mouth every 6 (six) hours as needed. For pain 05/20/15   Linna HoffJames D Kindl, MD  ibuprofen (ADVIL,MOTRIN) 200 MG tablet Take 800 mg by mouth every 8 (eight) hours as needed for moderate pain.     Historical Provider, MD  levETIRAcetam (KEPPRA) 750 MG tablet Take 750 mg by mouth 2 (two) times daily.    Historical Provider, MD  oxyCODONE (ROXICODONE) 15 MG immediate release tablet Take 1 tablet (15 mg total) by mouth every 6 (six) hours as needed for pain. 05/16/15   Doreene ElandKehinde T Eniola, MD    Family History Family History  Problem Relation Age of Onset  . Breast cancer Mother   . Pancreatic cancer Mother   . Diabetes Mother   . Heart disease Mother   . Prostate cancer Father   . Heart failure Maternal Grandmother     Social History Social History  Substance Use Topics  . Smoking status: Never Smoker  . Smokeless tobacco: Never Used  . Alcohol use No     Allergies   Nsaids and Tolmetin   Review of Systems Review of  Systems  All other systems reviewed and are negative.    Physical Exam Updated Vital Signs BP 133/84   Pulse 95   Temp 98.7 F (37.1 C) (Oral)   Resp 17   SpO2 98%   Physical Exam  Constitutional: He is oriented to person, place, and time. He appears well-developed and well-nourished.  HENT:  Head: Normocephalic and atraumatic.  Eyes: Conjunctivae are normal. Pupils are equal, round, and reactive to light. Right eye exhibits no discharge. Left eye exhibits no discharge. No scleral icterus.  Neck: Normal range of motion. No JVD present. No tracheal deviation present.  Pulmonary/Chest: Effort normal. No stridor.  Abdominal: Soft. He exhibits no distension.    Musculoskeletal:  No CT or L-spine tenderness. Tenderness to palpation of the lateral lumbar soft tissue and sacrum. No signs of trauma. Distal sensation strength and motor function 5 out of 5, patient ambulate without significant difficulty.  Neurological: He is alert and oriented to person, place, and time. Coordination normal.  Psychiatric: He has a normal mood and affect. His behavior is normal. Judgment and thought content normal.  Nursing note and vitals reviewed.    ED Treatments / Results  Labs (all labs ordered are listed, but only abnormal results are displayed) Labs Reviewed  COMPREHENSIVE METABOLIC PANEL - Abnormal; Notable for the following:       Result Value   Glucose, Bld 104 (*)    ALT 16 (*)    All other components within normal limits  CBC - Abnormal; Notable for the following:    WBC 12.4 (*)    All other components within normal limits  URINALYSIS, ROUTINE W REFLEX MICROSCOPIC - Abnormal; Notable for the following:    Hgb urine dipstick MODERATE (*)    Squamous Epithelial / LPF 0-5 (*)    All other components within normal limits    EKG  EKG Interpretation None       Radiology Dg Lumbar Spine Complete  Result Date: 06/11/2016 CLINICAL DATA:  Pain without trauma EXAM: LUMBAR SPINE - COMPLETE 4+ VIEW COMPARISON:  None. FINDINGS: There is no evidence of lumbar spine fracture. Alignment is normal. Intervertebral disc spaces are maintained. IMPRESSION: Negative. Electronically Signed   By: Gerome Sam III M.D   On: 06/11/2016 18:33   Dg Sacrum/coccyx  Result Date: 06/11/2016 CLINICAL DATA:  Pain without trauma EXAM: SACRUM AND COCCYX - 2+ VIEW COMPARISON:  None. FINDINGS: There is no evidence of fracture or other focal bone lesions. IMPRESSION: Negative. Electronically Signed   By: Gerome Sam III M.D   On: 06/11/2016 18:32   Dg Abd 1 View  Result Date: 06/11/2016 CLINICAL DATA:  Abdominal discomfort. EXAM: ABDOMEN - 1 VIEW COMPARISON:  None.  FINDINGS: The bowel gas pattern is normal. No radio-opaque calculi or other significant radiographic abnormality are seen. IMPRESSION: Negative. Electronically Signed   By: Gerome Sam III M.D   On: 06/11/2016 18:33    Procedures Procedures (including critical care time)  Medications Ordered in ED Medications  acetaminophen (TYLENOL) tablet 650 mg (650 mg Oral Given 06/11/16 1835)     Initial Impression / Assessment and Plan / ED Course  I have reviewed the triage vital signs and the nursing notes.  Pertinent labs & imaging results that were available during my care of the patient were reviewed by me and considered in my medical decision making (see chart for details).      Final Clinical Impressions(s) / ED Diagnoses   Final diagnoses:  Sacral  pain  Gastroenteritis    36 year old male presents today with complaints of back pain. He has no signs of trauma, no findings on plain films. While in x-ray technologist reported abnormal findings on the lumbar films of the abdomen and questioned if we needed acute abdomen series. Patient was having no abdominal pain but based on her findings abdomen plain films were ordered. These returned without any significant findings. Patient appears uncomfortable and slightly anxious. With patient taking high-dose benzodiazepines over the last month I have concern for acute withdrawal. Patient will be given 6 tabs, encouraged follow-up with primary care for reassessment ongoing management of his chronic issues. Patient was referred to orthopedics for his lower back pain. Patient will not be prescribed narcotic pain medication, I encouraged him to discontinue using narcotics.  New Prescriptions Discharge Medication List as of 06/11/2016  7:16 PM    START taking these medications   Details  diazepam (VALIUM) 5 MG tablet Take 1 tablet (5 mg total) by mouth 2 (two) times daily., Starting Sat 06/11/2016, Print         Eyvonne Mechanic, PA-C 06/11/16  2032    Laurence Spates, MD 06/11/16 [phone]

## 2016-06-11 NOTE — ED Notes (Signed)
Pt st's he was lifting heavy furniture 4 days ago, st's he started having lower back pain the next day.  Pt st's he has taken Advil, Tylenol and Hydrocodone without relief.   St's unable to sleep due to the pain.

## 2016-07-08 ENCOUNTER — Ambulatory Visit (HOSPITAL_COMMUNITY)
Admission: EM | Admit: 2016-07-08 | Discharge: 2016-07-08 | Disposition: A | Discharge status: Other Acute Inpatient Hospital | Payer: Self-pay | Attending: Emergency Medicine | Admitting: Emergency Medicine

## 2016-07-08 ENCOUNTER — Encounter (HOSPITAL_COMMUNITY): Payer: Self-pay

## 2016-07-08 ENCOUNTER — Encounter (HOSPITAL_COMMUNITY): Payer: Self-pay | Admitting: *Deleted

## 2016-07-08 ENCOUNTER — Emergency Department (HOSPITAL_COMMUNITY): Payer: Self-pay

## 2016-07-08 ENCOUNTER — Emergency Department (HOSPITAL_COMMUNITY)
Admission: EM | Admit: 2016-07-08 | Discharge: 2016-07-08 | Disposition: A | Discharge status: Home / Self Care | Payer: Self-pay | Attending: Emergency Medicine | Admitting: Emergency Medicine

## 2016-07-08 DIAGNOSIS — F419 Anxiety disorder, unspecified: Secondary | ICD-10-CM | POA: Insufficient documentation

## 2016-07-08 DIAGNOSIS — E039 Hypothyroidism, unspecified: Secondary | ICD-10-CM | POA: Insufficient documentation

## 2016-07-08 DIAGNOSIS — R000 Tachycardia, unspecified: Secondary | ICD-10-CM

## 2016-07-08 DIAGNOSIS — R9431 Abnormal electrocardiogram [ECG] [EKG]: Secondary | ICD-10-CM

## 2016-07-08 LAB — BASIC METABOLIC PANEL
ANION GAP: 10 (ref 5–15)
BUN: 9 mg/dL (ref 6–20)
CALCIUM: 9.5 mg/dL (ref 8.9–10.3)
CO2: 24 mmol/L (ref 22–32)
Chloride: 102 mmol/L (ref 101–111)
Creatinine, Ser: 1.06 mg/dL (ref 0.61–1.24)
GLUCOSE: 107 mg/dL — AB (ref 65–99)
POTASSIUM: 4 mmol/L (ref 3.5–5.1)
Sodium: 136 mmol/L (ref 135–145)

## 2016-07-08 LAB — CBC
HEMATOCRIT: 45.7 % (ref 39.0–52.0)
HEMOGLOBIN: 15.1 g/dL (ref 13.0–17.0)
MCH: 29.8 pg (ref 26.0–34.0)
MCHC: 33 g/dL (ref 30.0–36.0)
MCV: 90.1 fL (ref 78.0–100.0)
Platelets: 258 10*3/uL (ref 150–400)
RBC: 5.07 MIL/uL (ref 4.22–5.81)
RDW: 15.3 % (ref 11.5–15.5)
WBC: 11.1 10*3/uL — ABNORMAL HIGH (ref 4.0–10.5)

## 2016-07-08 LAB — I-STAT TROPONIN, ED: TROPONIN I, POC: 0 ng/mL (ref 0.00–0.08)

## 2016-07-08 MED ORDER — ONDANSETRON 4 MG PO TBDP
4.0000 mg | ORAL_TABLET | Freq: Once | ORAL | Status: AC
  Administered 2016-07-08: 4 mg via ORAL
  Filled 2016-07-08: qty 1

## 2016-07-08 MED ORDER — DIAZEPAM 10 MG PO TABS: 10.0000 mg | ORAL_TABLET | Freq: Three times a day (TID) | ORAL | 0 refills | Status: DC | PRN

## 2016-07-08 MED ORDER — HYDROXYZINE HCL 25 MG PO TABS: 25.0000 mg | ORAL_TABLET | Freq: Four times a day (QID) | ORAL | 0 refills | Status: DC | PRN

## 2016-07-08 MED ORDER — LORAZEPAM 1 MG PO TABS
2.0000 mg | ORAL_TABLET | Freq: Once | ORAL | Status: AC
  Administered 2016-07-08: 2 mg via ORAL
  Filled 2016-07-08: qty 2

## 2016-07-08 NOTE — Discharge Instructions (Signed)
Go there now. This may be due to anxiety, however, you have some concerning EKG changes. We want to make sure that this is not a pulmonary embolus causing your symptoms.

## 2016-07-08 NOTE — ED Notes (Signed)
Pt A&Ox4, ambulatory at d/c with steady gait, NAD 

## 2016-07-08 NOTE — ED Provider Notes (Signed)
[facility] Provider Note   CSN: [id number] Arrival date & time: 07/08/16  1601     History   Chief Complaint Chief Complaint  Patient presents with  . Abnormal ECG    HPI Gregory Bush is a 36 y.o. male.  Patient is a 36 year old male with a history of seizures, migraines, anxiety and hypothyroid disease presenting today from urgent care due to concern for an abnormal EKG. Patient states he went to urgent care because he has had significantly more anxiety in the last month because he has been under extreme stress due to the death of his mom, loss of insurance and other stressors in his life. He was recently changed to Valium 3 times a day for his anxiety which is not helping significantly. He states today when he woke up he felt an anxiety attack coming on. He states it starts in his abdomen as a fluttering feeling and then moves up into his chest and sometimes causes him to vomit. In the past he has had seizures related to his severe anxiety and he was concerned that might happen today which is why he went to urgent care. Denies unilateral leg pain or swelling, prior history of DVT or clot disorder in his family, tobacco use, drug or alcohol use. No SI.  He currently denies any pressure or discomfort in his chest but states when he had earlier just makes him feel like he's suffocating.   The history is provided by the patient.    Past Medical History:  Diagnosis Date  . Anxiety   . Hypothyroidism   . IBS (irritable bowel syndrome)   . Jaw fracture (HCC)   . Migraines   . Seizures [facility])     Patient Active Problem List   Diagnosis Date Noted  . AP (abdominal pain) 04/07/2014  . Abnormal CT scan 04/07/2014  . Loss of weight 04/07/2014  . ACUTE GASTRITIS WITHOUT MENTION OF HEMORRHAGE 11/06/2009  . DIARRHEA 10/30/2009  . UNSPECIFIED HYPOTHYROIDISM 10/29/2009  . HYPERCHOLESTEROLEMIA 10/29/2009  . ANXIETY 10/29/2009  . MICROSCOPIC HEMATURIA 10/29/2009    Past Surgical  History:  Procedure Laterality Date  . COLONOSCOPY W/ BIOPSIES    . ESOPHAGOGASTRODUODENOSCOPY    . EYE MUSCLE SURGERY Bilateral 1985  . HIP SURGERY    . MANDIBLE SURGERY         Home Medications    Prior to Admission medications   Medication Sig Start Date End Date Taking? Authorizing Provider  ALPRAZolam Prudy Feeler) 1 MG tablet Take 1 mg by mouth 3 (three) times daily as needed for anxiety.  12/01/14   Historical Provider, MD  diazepam (VALIUM) 5 MG tablet Take 1 tablet (5 mg total) by mouth 2 (two) times daily. 06/11/16   Eyvonne Mechanic, PA-C  HYDROcodone-acetaminophen (NORCO/VICODIN) 5-325 MG tablet Take 1 tablet by mouth every 6 (six) hours as needed. For pain 05/20/15   Linna Hoff, MD  ibuprofen (ADVIL,MOTRIN) 200 MG tablet Take 800 mg by mouth every 8 (eight) hours as needed for moderate pain.     Historical Provider, MD  levETIRAcetam (KEPPRA) 750 MG tablet Take 750 mg by mouth 2 (two) times daily.    Historical Provider, MD  oxyCODONE (ROXICODONE) 15 MG immediate release tablet Take 1 tablet (15 mg total) by mouth every 6 (six) hours as needed for pain. 05/16/15   Doreene Eland, MD  TraMADol HCl (ULTRAM PO) Take by mouth.    Historical Provider, MD    Family History Family History  Problem Relation Age of Onset  . Breast cancer Mother   . Pancreatic cancer Mother   . Diabetes Mother   . Heart disease Mother   . Prostate cancer Father   . Heart failure Maternal Grandmother     Social History Social History  Substance Use Topics  . Smoking status: Never Smoker  . Smokeless tobacco: Never Used  . Alcohol use No     Allergies   Nsaids and Tolmetin   Review of Systems Review of Systems  All other systems reviewed and are negative.    Physical Exam Updated Vital Signs BP 141/83 (BP Location: Left Arm)   Pulse 113   Temp 98.4 F (36.9 C) (Oral)   Resp 18   Ht 5\' 8"  (1.727 m)   Wt 160 lb (72.6 kg)   SpO2 100%   BMI 24.33 kg/m   Physical Exam    Constitutional: He is oriented to person, place, and time. He appears well-developed and well-nourished. No distress.  Anxious  HENT:  Head: Normocephalic and atraumatic.  Mouth/Throat: Oropharynx is clear and moist.  Eyes: Conjunctivae and EOM are normal. Pupils are equal, round, and reactive to light.  Neck: Normal range of motion. Neck supple.  Cardiovascular: Regular rhythm and intact distal pulses.  Tachycardia present.   No murmur heard. Pulmonary/Chest: Effort normal and breath sounds normal. No respiratory distress. He has no wheezes. He has no rales.  Abdominal: Soft. He exhibits no distension. There is no tenderness. There is no rebound and no guarding.  Musculoskeletal: Normal range of motion. He exhibits no edema or tenderness.  Neurological: He is alert and oriented to person, place, and time.  Skin: Skin is warm and dry. No rash noted. No erythema.  Psychiatric: His behavior is normal. His mood appears anxious. He expresses no homicidal and no suicidal ideation.  Nursing note and vitals reviewed.    ED Treatments / Results  Labs (all labs ordered are listed, but only abnormal results are displayed) Labs Reviewed  BASIC METABOLIC PANEL - Abnormal; Notable for the following:       Result Value   Glucose, Bld 107 (*)    All other components within normal limits  CBC - Abnormal; Notable for the following:    WBC 11.1 (*)    All other components within normal limits  I-STAT TROPOININ, ED    EKG  EKG Interpretation  Date/Time:  Friday July 08 2016 16:11:29 EST Ventricular Rate:  113 PR Interval:  122 QRS Duration: 94 QT Interval:  318 QTC Calculation: 436 R Axis:   126 Text Interpretation:  Sinus tachycardia Left posterior fascicular block No significant change since last tracing Confirmed by Anitra Lauth  MD, Sriram Febles ([id number]) on 07/08/2016 6:44:02 PM       Radiology Dg Chest 2 View  Result Date: 07/08/2016 CLINICAL DATA:  Abnormal EKG EXAM: CHEST  2 VIEW  COMPARISON:  12/16/2014 FINDINGS: Cardiomediastinal silhouette is stable. No infiltrate or pleural effusion. No pulmonary edema. Bony thorax is unremarkable. IMPRESSION: No active cardiopulmonary disease. Electronically Signed   By: Natasha Mead M.D.   On: 07/08/2016 17:14    Procedures Procedures (including critical care time)  Medications Ordered in ED Medications  LORazepam (ATIVAN) tablet 2 mg (2 mg Oral Given 07/08/16 1913)  ondansetron (ZOFRAN-ODT) disintegrating tablet 4 mg (4 mg Oral Given 07/08/16 1913)     Initial Impression / Assessment and Plan / ED Course  I have reviewed the triage vital signs and the nursing notes.  Pertinent labs & imaging results that were available during my care of the patient were reviewed by me and considered in my medical decision making (see chart for details).     Patient is a 36 y/o male presenting today from urgent care with concern for abnormal EKG. Patient describes his symptoms very typical of an anxiety attack. He suffers from anxiety and has had increased stress this month with the death of his mother. He describes it as a pain that starts in his epigastric area and then tightness in his chest.  He denies infectious symptoms, he has no risk factors for PE and low suspicion at this time. EKG shows sinus tachycardia but otherwise is unchanged from an EKG in 2011.  Will treat anxiety with Ativan and Zofran. We'll reevaluate.  9:14 PM Pt feeling much better.  Eating and drinking.  States anxiety is improved.  After further discussion pt has been out of valium for the last few days.  Given valium and vistaril.  Encouraged to f/u with pcp on monday Final Clinical Impressions(s) / ED Diagnoses   Final diagnoses:  Anxiety  Tachycardia    New Prescriptions New Prescriptions   DIAZEPAM (VALIUM) 10 MG TABLET    Take 1 tablet (10 mg total) by mouth every 8 (eight) hours as needed for anxiety.   HYDROXYZINE (ATARAX/VISTARIL) 25 MG TABLET    Take 1 tablet  (25 mg total) by mouth every 6 (six) hours as needed for anxiety.     Gwyneth SproutWhitney Yarexi Pawlicki, MD 07/08/16 2115

## 2016-07-08 NOTE — ED Provider Notes (Signed)
HPI  SUBJECTIVE:  Gregory Bush is a 36 y.o. male who presents with anxiety and shortness of breath. He states that he has been constantly anxious for the past 3 days and hasn't slept in 2 nights. He reports palpitations and shortness of breath, but states that he has had the shortness of breath with previous anxiety. He reports one episode of emesis last night which is typical of his anxiety. States that he ran out of his Valium last night. States that it was changed to 10 mg 3 times a day recently. States that he lost his insurance and cannot afford to see his PMD. He does state that the Klonopin has worked better for him than Valium in the past. He has tried Valium 10 mg 3 times a day, tramadol, ibuprofen. Symptoms are better with the Valium. No aggravating factors. He denies nausea, cough, chest pain, pleuritic pain, hemoptysis. No lower extremity edema, calf pain, recent immobilization, prolonged protracted. No surgery in the past 4 weeks. No history of PE, DVT, cancer. He does not drink any caffeine. No herbal weight loss supplements. No abdominal pain, syncope. No suicidal homicidal ideation, auditory or visual hallucinations. Family history negative for DVT or PE.  Past medical history of anxiety and seizures that were thought to be due to anxiety. He's had 2 jaw fractures due to the seizures. He is concerned that he is going to have another seizure due to his anxiety. States that the last time this happened he had not slept for 3 days.  Past Medical History:  Diagnosis Date  . Anxiety   . Hypothyroidism   . IBS (irritable bowel syndrome)   . Jaw fracture (HCC)   . Migraines   . Seizures (HCC)     Past Surgical History:  Procedure Laterality Date  . COLONOSCOPY W/ BIOPSIES    . ESOPHAGOGASTRODUODENOSCOPY    . EYE MUSCLE SURGERY Bilateral 1985  . HIP SURGERY    . MANDIBLE SURGERY      Family History  Problem Relation Age of Onset  . Breast cancer Mother   . Pancreatic cancer  Mother   . Diabetes Mother   . Heart disease Mother   . Prostate cancer Father   . Heart failure Maternal Grandmother     Social History  Substance Use Topics  . Smoking status: Never Smoker  . Smokeless tobacco: Never Used  . Alcohol use No    No current facility-administered medications for this encounter.   Current Outpatient Prescriptions:  .  TraMADol HCl (ULTRAM PO), Take by mouth., Disp: , Rfl:  .  ALPRAZolam (XANAX) 1 MG tablet, Take 1 mg by mouth 3 (three) times daily as needed for anxiety. , Disp: , Rfl: 0 .  diazepam (VALIUM) 5 MG tablet, Take 1 tablet (5 mg total) by mouth 2 (two) times daily., Disp: 6 tablet, Rfl: 0 .  HYDROcodone-acetaminophen (NORCO/VICODIN) 5-325 MG tablet, Take 1 tablet by mouth every 6 (six) hours as needed. For pain, Disp: 20 tablet, Rfl: 0 .  ibuprofen (ADVIL,MOTRIN) 200 MG tablet, Take 800 mg by mouth every 8 (eight) hours as needed for moderate pain. , Disp: , Rfl:  .  levETIRAcetam (KEPPRA) 750 MG tablet, Take 750 mg by mouth 2 (two) times daily., Disp: , Rfl:  .  oxyCODONE (ROXICODONE) 15 MG immediate release tablet, Take 1 tablet (15 mg total) by mouth every 6 (six) hours as needed for pain., Disp: 20 tablet, Rfl: 0  Allergies  Allergen Reactions  . Nsaids  Diarrhea and Nausea And Vomiting    Currently taking Tylenol and 800mg  IBU per pt.  . Tolmetin Diarrhea and Nausea And Vomiting     ROS  As noted in HPI.   Physical Exam  BP 122/88 (BP Location: Left Arm)   Pulse (!) 134 Comment: notified cma  Temp 99.1 F (37.3 C)   Resp 16   SpO2 100%   Constitutional: Well developed, well nourished, appears anxious. Eyes: PERRL, EOMI, conjunctiva normal bilaterally HENT: Normocephalic, atraumatic,mucus membranes moist Respiratory: Clear to auscultation bilaterally, no rales, no wheezing, no rhonchi Cardiovascular: Regular tachycardia, no murmurs, no gallops, no rubs GI: Soft, nondistended, normal bowel sounds, nontender, no rebound, no  guarding skin: No rash, skin intact Musculoskeletal: Calves symmetric, nontender No edema, no tenderness, no deformities Neurologic: Alert & oriented x 3, CN II-XII grossly intact, no motor deficits, sensation grossly intact Psychiatric: Speech and behavior appropriate   ED Course   Medications - No data to display  Orders Placed This Encounter  Procedures  . EKG 12-Lead    Standing Status:   Standing    Number of Occurrences:   1   No results found for this or any previous visit (from the past 24 hour(s)). No results found.  ED Clinical Impression  Tachycardia  Abnormal EKG   ED Assessment/Plan  Previous records, EKGs reviewed. As noted in history of present illness.  EKG: Sinus tachycardia, rate 129. Normal axis, normal intervals.  Prominent S in lead 1. Isolated Q-wave in 3 with  T-wave inversion. No ST elevation. These EKG changes are new compared to previous EKG from 12/18/14  [facility] narcotic database reviewed. Patient prescribed diazepam 10 mg #60 on 06/13/2016, tramadol #240. He has been getting his benzodiazepines and narcotics from the same provider for the past 6 months.  concern for PE given the shortness of breath, tachycardia and S1Q3 T3 EKG changes. He ran our his Valium last night, so he should not be tachycardic due to withdrawal. Anxiety certainly in the differential, however, it would be unusual for to be this tachycardic due to anxiety. There are no other specific reasons for his tachycardia that I can elicit on history.  Plan to transfer to the [place] to rule out PE. Giving patient copy of EKG. Discussed the importance of going to the ED to rule this out. Patient agrees with plan.   Meds ordered this encounter  Medications  . TraMADol HCl (ULTRAM PO)    Sig: Take by mouth.    *This clinic note was created using Dragon dictation software. Therefore, there may be occasional mistakes despite careful proofreading.  ?   Domenick Gong,  MD 07/08/16 1556

## 2016-07-08 NOTE — ED Notes (Signed)
According to paperwork from urgent care, they were concerned for possible PE due to his EKG abnormality concerns.

## 2016-07-08 NOTE — ED Triage Notes (Signed)
Anxiety  X  2  Days    Sleeplessness  Last  Pm  Worse  Today       Pt  States  Last  Time    It  Happened       He  Had  A  Seizure  1.5      Years  Ago         Vomited  X  1    Frequent    stools

## 2016-07-08 NOTE — ED Triage Notes (Signed)
Pt reports he has been feeling anxious and went to urgent care. They did an EKG there and sent him here for it being abnormal. Pt is not sure what was abnormal about it. Denies chest pain but reports jaw pain from vomiting last night. He still reports feeling extremely anxious.

## 2016-07-08 NOTE — ED Notes (Signed)
Dr. Anitra Lauthplunkett at bedside  Patient appears anxious, tachycardic and tachypneic, and general appearance presents as anxious

## 2016-08-26 ENCOUNTER — Emergency Department (HOSPITAL_BASED_OUTPATIENT_CLINIC_OR_DEPARTMENT_OTHER)
Admission: EM | Admit: 2016-08-26 | Discharge: 2016-08-26 | Disposition: A | Discharge status: Home / Self Care | Payer: Self-pay | Attending: Emergency Medicine | Admitting: Emergency Medicine

## 2016-08-26 ENCOUNTER — Encounter (HOSPITAL_BASED_OUTPATIENT_CLINIC_OR_DEPARTMENT_OTHER): Payer: Self-pay | Admitting: *Deleted

## 2016-08-26 DIAGNOSIS — Y939 Activity, unspecified: Secondary | ICD-10-CM | POA: Insufficient documentation

## 2016-08-26 DIAGNOSIS — S81811A Laceration without foreign body, right lower leg, initial encounter: Secondary | ICD-10-CM | POA: Insufficient documentation

## 2016-08-26 DIAGNOSIS — Y929 Unspecified place or not applicable: Secondary | ICD-10-CM | POA: Insufficient documentation

## 2016-08-26 DIAGNOSIS — Y999 Unspecified external cause status: Secondary | ICD-10-CM | POA: Insufficient documentation

## 2016-08-26 DIAGNOSIS — W268XXA Contact with other sharp object(s), not elsewhere classified, initial encounter: Secondary | ICD-10-CM | POA: Insufficient documentation

## 2016-08-26 DIAGNOSIS — E039 Hypothyroidism, unspecified: Secondary | ICD-10-CM | POA: Insufficient documentation

## 2016-08-26 MED ORDER — LIDOCAINE HCL (PF) 1 % IJ SOLN
30.0000 mL | Freq: Once | INTRAMUSCULAR | Status: AC
Start: 2016-08-26 — End: 2016-08-26
  Administered 2016-08-26: 30 mL
  Filled 2016-08-26: qty 30

## 2016-08-26 MED ORDER — LORAZEPAM 1 MG PO TABS
1.0000 mg | ORAL_TABLET | Freq: Once | ORAL | Status: AC
  Administered 2016-08-26: 1 mg via ORAL
  Filled 2016-08-26: qty 1

## 2016-08-26 NOTE — ED Triage Notes (Signed)
2 in laceration to inside of right calf.  Bleeding controlled.

## 2016-08-26 NOTE — ED Provider Notes (Signed)
[facility] Provider Note   CSN: [id number] Arrival date & time: 08/26/16  1519     History   Chief Complaint Chief Complaint  Patient presents with  . Extremity Laceration    HPI Gregory Bush is a 36 y.o. male presenting after injury to the right lower leg. 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 He denies any broken pieces or possibility of foreign body. He confirms is up-to-date status of tetanus immunization. He has not taken anything for pain and refuses anything for pain at this time. Patient declines x-ray. Denies any numbness, tingling, swelling or any other injuries.   HPI  Past Medical History:  Diagnosis Date  . Anxiety   . Hypothyroidism   . IBS (irritable bowel syndrome)   . Jaw fracture (HCC)   . Migraines   . Seizures [facility])     Patient Active Problem List   Diagnosis Date Noted  . AP (abdominal pain) 04/07/2014  . Abnormal CT scan 04/07/2014  . Loss of weight 04/07/2014  . ACUTE GASTRITIS WITHOUT MENTION OF HEMORRHAGE 11/06/2009  . DIARRHEA 10/30/2009  . UNSPECIFIED HYPOTHYROIDISM 10/29/2009  . HYPERCHOLESTEROLEMIA 10/29/2009  . ANXIETY 10/29/2009  . MICROSCOPIC HEMATURIA 10/29/2009    Past Surgical History:  Procedure Laterality Date  . COLONOSCOPY W/ BIOPSIES    . ESOPHAGOGASTRODUODENOSCOPY    . EYE MUSCLE SURGERY Bilateral 1985  . HIP SURGERY    . MANDIBLE SURGERY         Home Medications    Prior to Admission medications   Medication Sig Start Date End Date Taking? Authorizing Provider  diazepam (VALIUM) 10 MG tablet Take 1 tablet (10 mg total) by mouth every 8 (eight) hours as needed for anxiety. 07/08/16   Gwyneth Sprout, MD  ibuprofen (ADVIL,MOTRIN) 200 MG tablet Take 800 mg by mouth every 8 (eight) hours as needed for moderate pain.     Historical Provider, MD  TraMADol HCl (ULTRAM PO) Take by mouth.     Historical Provider, MD    Family History Family History  Problem Relation Age of Onset  . Breast cancer Mother   . Pancreatic cancer Mother   . Diabetes Mother   . Heart disease Mother   . Prostate cancer Father   . Heart failure Maternal Grandmother     Social History Social History  Substance Use Topics  . Smoking status: Never Smoker  . Smokeless tobacco: Never Used  . Alcohol use No     Allergies   Nsaids and Tolmetin   Review of Systems Review of Systems  Constitutional: Negative for chills and fever.  Eyes: Negative for pain and visual disturbance.  Respiratory: Negative for cough, shortness of breath and stridor.   Cardiovascular: Negative for chest pain, palpitations and leg swelling.  Musculoskeletal: Negative for arthralgias, back pain and myalgias.  Skin: Positive for wound. Negative for color change, pallor and rash.  Neurological: Negative for seizures, syncope, light-headedness and numbness.  All other systems reviewed and are negative.    Physical Exam Updated Vital Signs BP (!) 153/94 (BP Location: Left Arm)   Pulse (!) 103   Temp 98.5 F (36.9 C) (Oral)   Resp 18   Ht  (1.727 m)   Wt 82.6 kg   SpO2 100%   BMI 27.67 kg/m   Physical Exam  Constitutional: He appears well-developed and well-nourished. No distress.  Afebrile,  nontoxic-appearing, sitting comfortably in bed in no acute distress.   HENT:  Head: Normocephalic and atraumatic.  Eyes: Conjunctivae and EOM are normal.  Neck: Normal range of motion. Neck supple.  Cardiovascular: Normal rate, regular rhythm, normal heart sounds and intact distal pulses.   No murmur heard. Pulmonary/Chest: Effort normal and breath sounds normal. No respiratory distress. He has no wheezes. He has no rales. He exhibits no tenderness.  Musculoskeletal: Normal range of motion. He exhibits no edema or deformity.  Patient was ambulatory after the injury. Full range of motion of the knee. No focal  deficits. Neurovascularly intact distally  Neurological: He is alert.  Skin: Skin is warm and dry. No rash noted. He is not diaphoretic. No erythema. No pallor.  Right posterior calf linear laceration approximately 3 cm.  Psychiatric: He has a normal mood and affect.  Nursing note and vitals reviewed.    ED Treatments / Results  Labs (all labs ordered are listed, but only abnormal results are displayed) Labs Reviewed - No data to display  EKG  EKG Interpretation None       Radiology No results found.  Procedures Procedures (including critical care time)  LACERATION REPAIR Performed by: Georgiana Shore Authorized by: Georgiana Shore Consent: Verbal consent obtained. Risks and benefits: risks, benefits and alternatives were discussed Consent given by: patient Patient identity confirmed: provided demographic data Prepped and Draped in normal sterile fashion Wound explored  Laceration Location: right lower leg  Laceration Length: 4 cm  No Foreign Bodies seen or palpated  Anesthesia: local infiltration  Local anesthetic: lidocaine 1% w/o epinephrine  Anesthetic total:  ml  Irrigation method: syringe Amount of cleaning: standard  Skin closure: 4.0 non-absorbable sutures  Number of sutures: 4  Technique: horizontal mattress/simple interrupted  Patient tolerance: Patient tolerated the procedure well with no immediate complications.   Medications Ordered in ED Medications  LORazepam (ATIVAN) tablet 1 mg (1 mg Oral Given 08/26/16 1637)  lidocaine (PF) (XYLOCAINE) 1 % injection 30 mL (30 mLs Infiltration Given by Other 08/26/16 1641)     Initial Impression / Assessment and Plan / ED Course  I have reviewed the triage vital signs and the nursing notes.  Pertinent labs & imaging results that were available during my care of the patient were reviewed by me and considered in my medical decision making (see chart for details).     Patient presents after  laceration of right posterior lower leg proximally 4 cm. Patient declined possibility of foreign body and did not want any x-ray.  Wound was well irrigated with pressure and explored. No evidence of tendon injury. Full range of motion of the knee.  Wound approximated well using 3 horizontal mattress sutures and one simple interrupted.  Will discharge home with close PCP follow-up for suture removal in 10-14 days.  Pressure irrigation performed. Wound explored and base of wound visualized in a bloodless field without evidence of foreign body.  Laceration occurred < 8 hours prior to repair which was well tolerated. Tdap up to date per patient.  Pt has no comorbidities to effect normal wound healing. Pt discharged without antibiotics.  Discussed suture home care with patient and answered questions. Pt to follow-up for wound check and suture removal in 10-14 days; they are to return to the ED sooner for signs of infection. Pt is hemodynamically stable with no complaints prior to dc.   Discussed strict return precautions and advised to return to the emergency department if experiencing any new  or worsening symptoms. Instructions were understood and patient agreed with discharge plan.  Final Clinical Impressions(s) / ED Diagnoses   Final diagnoses:  Laceration of right lower extremity, initial encounter    New Prescriptions New Prescriptions   No medications on file     Gregary Cromer 08/26/16 Lavell Anchors, MD 09/05/16 [phone]

## 2016-08-26 NOTE — ED Notes (Signed)
Bleeding controlled with ABD bandage.

## 2016-08-26 NOTE — Discharge Instructions (Addendum)
As discussed, keep the area clean and dry. Monitor for any signs of infection including swelling, redness, purulent discharge, fever, chills, increased pain.  Follow-up with the primary care provider in 10-14 days for suture removal. Return to the emergency department if you experience any signs and symptoms of infection in the meantime.  Ibuprofen for pain

## 2016-10-30 ENCOUNTER — Ambulatory Visit (HOSPITAL_COMMUNITY)
Admission: EM | Admit: 2016-10-30 | Discharge: 2016-10-30 | Disposition: A | Discharge status: Home / Self Care | Payer: Self-pay | Attending: Internal Medicine | Admitting: Internal Medicine

## 2016-10-30 ENCOUNTER — Encounter (HOSPITAL_COMMUNITY): Payer: Self-pay | Admitting: Emergency Medicine

## 2016-10-30 DIAGNOSIS — F419 Anxiety disorder, unspecified: Secondary | ICD-10-CM

## 2016-10-30 DIAGNOSIS — R6884 Jaw pain: Secondary | ICD-10-CM

## 2016-10-30 MED ORDER — DIAZEPAM 10 MG PO TABS: 10.0000 mg | ORAL_TABLET | Freq: Two times a day (BID) | ORAL | 0 refills | Status: DC | PRN

## 2016-10-30 NOTE — ED Triage Notes (Signed)
[facility] a [facility] pain and anxiety.

## 2016-10-30 NOTE — Discharge Instructions (Signed)
Take Valium as directed, follow up with your PCP on Thursday as scheduled. Go to Er for new or worsening symptoms. Avoid known stressors. Return to UC as needed.

## 2016-10-30 NOTE — ED Provider Notes (Signed)
CSN: [id number]     Arrival date & time 10/30/16  1428 History   None    Chief Complaint  Patient presents with  . Jaw Pain  . Anxiety   (Consider location/radiation/quality/duration/timing/severity/associated sxs/prior Treatment) 36 yr old male pt presents to UC with cc of chronic jaw pain and anxiety. Pt states he ran out of Valium 2 days ago, has MD appt on Thursday. Pt states d/t financial issues and loss of insurance hard to keep up with medical needs, is on 4th jaw surgery.    The history is provided by the patient. No language interpreter was used.    Past Medical History:  Diagnosis Date  . Anxiety   . Hypothyroidism   . IBS (irritable bowel syndrome)   . Jaw fracture (HCC)   . Migraines   . Seizures (HCC)    Past Surgical History:  Procedure Laterality Date  . COLONOSCOPY W/ BIOPSIES    . ESOPHAGOGASTRODUODENOSCOPY    . EYE MUSCLE SURGERY Bilateral 1985  . HIP SURGERY    . MANDIBLE SURGERY     Family History  Problem Relation Age of Onset  . Breast cancer Mother   . Pancreatic cancer Mother   . Diabetes Mother   . Heart disease Mother   . Prostate cancer Father   . Heart failure Maternal Grandmother    Social History  Substance Use Topics  . Smoking status: Never Smoker  . Smokeless tobacco: Never Used  . Alcohol use No    Review of Systems  Constitutional: Negative for chills and fever.  HENT: Negative for ear pain and sore throat.   Eyes: Negative for pain and visual disturbance.  Respiratory: Negative for cough and shortness of breath.   Cardiovascular: Negative for chest pain and palpitations.  Gastrointestinal: Negative for abdominal pain and vomiting.  Genitourinary: Negative for dysuria and hematuria.  Musculoskeletal: Positive for myalgias. Negative for arthralgias and back pain.  Skin: Negative for color change and rash.  Neurological: Negative for seizures and syncope.  Psychiatric/Behavioral: Positive for agitation and sleep disturbance.   All other systems reviewed and are negative.   Allergies  Tolmetin  Home Medications   Prior to Admission medications   Medication Sig Start Date End Date Taking? Authorizing Provider  ibuprofen (ADVIL,MOTRIN) 200 MG tablet Take 800 mg by mouth every 8 (eight) hours as needed for moderate pain.    Yes [provider]  TraMADol HCl (ULTRAM PO) Take by mouth.   Yes [provider]  diazepam (VALIUM) 10 MG tablet Take 1 tablet (10 mg total) by mouth every 12 (twelve) hours as needed for anxiety. 10/30/16   Carrol Bondar, Para MarchJeanette, NP   Meds Ordered and Administered this Visit  Medications - No data to display  BP (!) 137/98 (BP Location: Right Arm)   Pulse (!) 113   Temp 98.7 F (37.1 C) (Oral)   Resp 18   SpO2 100%  No data found.   Physical Exam  Constitutional: He is oriented to person, place, and time. He appears well-developed and well-nourished. He is active and cooperative.  HENT:  Head: Normocephalic and atraumatic.  Eyes: Conjunctivae are normal.  Neck: Neck supple.  Cardiovascular: Regular rhythm.  Tachycardia present.   No murmur heard. Pulmonary/Chest: Effort normal and breath sounds normal. No respiratory distress.  Abdominal: Soft. There is no tenderness.  Musculoskeletal: He exhibits no edema.  Neurological: He is alert and oriented to person, place, and time. GCS eye subscore is 4. GCS verbal subscore  is 5. GCS motor subscore is 6.  Skin: Skin is warm and dry.  Psychiatric: His speech is normal and behavior is normal. His mood appears anxious.  Nursing note and vitals reviewed.   Urgent Care Course     Procedures (including critical care time)  Labs Review Labs Reviewed - No data to display  Imaging Review No results found.       MDM   1. Anxiety     Take Valium as directed(#8 given, no refill), follow up with your PCP on Thursday as scheduled. Go to Er for new or worsening symptoms. Avoid known stressors. Return to UC as  needed.    Clancy Gourd, NP 10/30/16 1711

## 2016-11-15 ENCOUNTER — Emergency Department (HOSPITAL_COMMUNITY)
Admission: EM | Admit: 2016-11-15 | Discharge: 2016-11-16 | Disposition: A | Discharge status: Home / Self Care | Payer: Self-pay | Attending: Emergency Medicine | Admitting: Emergency Medicine

## 2016-11-15 ENCOUNTER — Encounter (HOSPITAL_COMMUNITY): Payer: Self-pay | Admitting: Family Medicine

## 2016-11-15 DIAGNOSIS — F32A Depression, unspecified: Secondary | ICD-10-CM | POA: Diagnosis present

## 2016-11-15 DIAGNOSIS — F419 Anxiety disorder, unspecified: Secondary | ICD-10-CM

## 2016-11-15 DIAGNOSIS — E039 Hypothyroidism, unspecified: Secondary | ICD-10-CM | POA: Insufficient documentation

## 2016-11-15 DIAGNOSIS — F329 Major depressive disorder, single episode, unspecified: Secondary | ICD-10-CM | POA: Insufficient documentation

## 2016-11-15 DIAGNOSIS — F1010 Alcohol abuse, uncomplicated: Secondary | ICD-10-CM

## 2016-11-15 LAB — COMPREHENSIVE METABOLIC PANEL
ALBUMIN: 4.6 g/dL (ref 3.5–5.0)
ALT: 27 U/L (ref 17–63)
ANION GAP: 12 (ref 5–15)
AST: 29 U/L (ref 15–41)
Alkaline Phosphatase: 58 U/L (ref 38–126)
BUN: 16 mg/dL (ref 6–20)
CALCIUM: 10 mg/dL (ref 8.9–10.3)
CHLORIDE: 104 mmol/L (ref 101–111)
CO2: 27 mmol/L (ref 22–32)
Creatinine, Ser: 1.1 mg/dL (ref 0.61–1.24)
GFR calc non Af Amer: >60 mL/min (ref >60)
GLUCOSE: 101 mg/dL — AB (ref 65–99)
POTASSIUM: 4.4 mmol/L (ref 3.5–5.1)
SODIUM: 143 mmol/L (ref 135–145)
Total Bilirubin: 0.3 mg/dL (ref 0.3–1.2)
Total Protein: 8.1 g/dL (ref 6.5–8.1)

## 2016-11-15 LAB — CBC
HEMATOCRIT: 43.3 % (ref 39.0–52.0)
HEMOGLOBIN: 14.9 g/dL (ref 13.0–17.0)
MCH: 30.2 pg (ref 26.0–34.0)
MCHC: 34.4 g/dL (ref 30.0–36.0)
MCV: 87.8 fL (ref 78.0–100.0)
Platelets: 211 10*3/uL (ref 150–400)
RBC: 4.93 MIL/uL (ref 4.22–5.81)
RDW: 13.6 % (ref 11.5–15.5)
WBC: 9.7 10*3/uL (ref 4.0–10.5)

## 2016-11-15 LAB — SALICYLATE LEVEL

## 2016-11-15 LAB — ACETAMINOPHEN LEVEL: ACETAMINOPHEN (TYLENOL), SERUM: 19 ug/mL (ref 10–30)

## 2016-11-15 LAB — ETHANOL: Alcohol, Ethyl (B): 148 mg/dL — ABNORMAL HIGH (ref <5)

## 2016-11-15 NOTE — ED Notes (Signed)
Bed: [facility] Expected date:  Expected time:  Means of arrival:  Comments: 

## 2016-11-15 NOTE — ED Notes (Signed)
Bed: [facility] Expected date:  Expected time:  Means of arrival:  Comments: 
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
## 2016-11-15 NOTE — ED Notes (Signed)
Patient was wanded before being transported. Patient ambulated with Kennis CarinaBarry, Sitter to room 29. Consuella LoseElaine, RN transferred care.

## 2016-11-15 NOTE — ED Notes (Signed)
Bed: [facility] Expected date:  Expected time:  Means of arrival:  Comments: 
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
## 2016-11-15 NOTE — ED Notes (Signed)
All of patients belongings have been given to patients father, Kathrene AluRonald Virgil, and he has placed them in his vehicle.

## 2016-11-15 NOTE — ED Provider Notes (Signed)
[facility]-EM[facility] DEPT Provider Note   CSN: [id number] Arrival date & time: 11/15/16  2044     History   Chief Complaint Chief Complaint  Patient presents with  . Suicidal    HPI Gregory Bush is a 36 y.o. male.  Patient with history of anxiety, IBS, depression, hypothyroidism presents with depression and fleeting suicidal thoughts. He reports history of intermittent suicidal thoughts without plan and no previous attempt. No HI/AVH. He has jaw pain that is chronic from previous fracture but no other physical complaints.   The history is provided by the patient. No language interpreter was used.    Past Medical History:  Diagnosis Date  . Anxiety   . Hypothyroidism   . IBS (irritable bowel syndrome)   . Jaw fracture (HCC)   . Migraines   . Seizures [facility](HCC)     Patient Active Problem List   Diagnosis Date Noted  . AP (abdominal pain) 04/07/2014  . Abnormal CT scan 04/07/2014  . Loss of weight 04/07/2014  . ACUTE GASTRITIS WITHOUT MENTION OF HEMORRHAGE 11/06/2009  . DIARRHEA 10/30/2009  . UNSPECIFIED HYPOTHYROIDISM 10/29/2009  . HYPERCHOLESTEROLEMIA 10/29/2009  . ANXIETY 10/29/2009  . MICROSCOPIC HEMATURIA 10/29/2009    Past Surgical History:  Procedure Laterality Date  . COLONOSCOPY W/ BIOPSIES    . ESOPHAGOGASTRODUODENOSCOPY    . EYE MUSCLE SURGERY Bilateral 1985  . HIP SURGERY    . MANDIBLE SURGERY         Home Medications    Prior to Admission medications   Medication Sig Start Date End Date Taking? Authorizing Provider  diazepam (VALIUM) 10 MG tablet Take 1 tablet (10 mg total) by mouth every 12 (twelve) hours as needed for anxiety. 10/30/16  Yes Defelice, Para MarchJeanette, NP  HYDROcodone-acetaminophen (NORCO) 10-325 MG tablet Take 1 tablet by mouth every 6 (six) hours as needed for moderate pain or severe pain.   Yes [provider]  ibuprofen (ADVIL,MOTRIN) 200 MG tablet Take 600 mg by mouth every 8 (eight) hours as needed for moderate pain.    Yes  [provider]    Family History Family History  Problem Relation Age of Onset  . Breast cancer Mother   . Pancreatic cancer Mother   . Diabetes Mother   . Heart disease Mother   . Prostate cancer Father   . Heart failure Maternal Grandmother     Social History Social History  Substance Use Topics  . Smoking status: Never Smoker  . Smokeless tobacco: Never Used  . Alcohol use 0.0 oz/week     Comment: Did drink today but normally does not drink. Drunk today to ease the pain. Last drink: 13:00pm. Drunk Vodka.      Allergies   Tolmetin   Review of Systems Review of Systems  Constitutional: Negative for chills and fever.  HENT: Negative.        C/O chronic jaw pain  Respiratory: Negative.   Cardiovascular: Negative.   Gastrointestinal: Negative.   Musculoskeletal: Negative.   Skin: Negative.   Neurological: Negative.   Psychiatric/Behavioral: Positive for dysphoric mood and suicidal ideas. Negative for self-injury.     Physical Exam Updated Vital Signs BP (!) 143/104 (BP Location: Right Arm)   Pulse (!) 104   Temp 99.1 F (37.3 C) (Oral)   Resp 20   Ht 5\' 8"  (1.727 m)   Wt 80.7 kg (178 lb)   SpO2 98%   BMI 27.06 kg/m   Physical Exam  Constitutional: He appears well-developed and well-nourished. No  distress.  HENT:  Head: Normocephalic.  Neck: Normal range of motion. Neck supple.  Cardiovascular: Normal rate and regular rhythm.   Pulmonary/Chest: Effort normal and breath sounds normal. He has no wheezes. He has no rales.  Abdominal: Soft. Bowel sounds are normal. There is no tenderness. There is no rebound and no guarding.  Musculoskeletal: Normal range of motion.  Neurological: He is alert.  Skin: Skin is warm and dry. No rash noted.  Psychiatric: His speech is normal and behavior is normal. He is not actively hallucinating. He exhibits a depressed mood. He expresses suicidal ideation.     ED Treatments / Results  Labs (all labs ordered  are listed, but only abnormal results are displayed) Labs Reviewed  COMPREHENSIVE METABOLIC PANEL - Abnormal; Notable for the following:       Result Value   Glucose, Bld 101 (*)    All other components within normal limits  ETHANOL - Abnormal; Notable for the following:    Alcohol, Ethyl (B) 148 (*)    All other components within normal limits  SALICYLATE LEVEL  ACETAMINOPHEN LEVEL  CBC  RAPID URINE DRUG SCREEN, HOSP PERFORMED   Results for orders placed or performed during the hospital encounter of 11/15/16  Comprehensive metabolic panel  Result Value Ref Range   Sodium 143 135 - 145 mmol/L   Potassium 4.4 3.5 - 5.1 mmol/L   Chloride 104 101 - 111 mmol/L   CO2 27 22 - 32 mmol/L   Glucose, Bld 101 (H) 65 - 99 mg/dL   BUN 16 6 - 20 mg/dL   Creatinine, Ser 4.09 0.61 - 1.24 mg/dL   Calcium 81.1 8.9 - 91.4 mg/dL   Total Protein 8.1 6.5 - 8.1 g/dL   Albumin 4.6 3.5 - 5.0 g/dL   AST 29 15 - 41 U/L   ALT 27 17 - 63 U/L   Alkaline Phosphatase 58 38 - 126 U/L   Total Bilirubin 0.3 0.3 - 1.2 mg/dL   GFR calc non Af Amer >60 >60 mL/min   GFR calc Af Amer >60 >60 mL/min   Anion gap 12 5 - 15  Ethanol  Result Value Ref Range   Alcohol, Ethyl (B) 148 (H) <5 mg/dL  Salicylate level  Result Value Ref Range   Salicylate Lvl <7.0 2.8 - 30.0 mg/dL  Acetaminophen level  Result Value Ref Range   Acetaminophen (Tylenol), Serum 19 10 - 30 ug/mL  cbc  Result Value Ref Range   WBC 9.7 4.0 - 10.5 K/uL   RBC 4.93 4.22 - 5.81 MIL/uL   Hemoglobin 14.9 13.0 - 17.0 g/dL   HCT 78.2 95.6 - 21.3 %   MCV 87.8 78.0 - 100.0 fL   MCH 30.2 26.0 - 34.0 pg   MCHC 34.4 30.0 - 36.0 g/dL   RDW 08.6 57.8 - 46.9 %   Platelets 211 150 - 400 K/uL    EKG  EKG Interpretation None       Radiology No results found.  Procedures Procedures (including critical care time)  Medications Ordered in ED Medications - No data to display   Initial Impression / Assessment and Plan / ED Course  I have  reviewed the triage vital signs and the nursing notes.  Pertinent labs & imaging results that were available during my care of the patient were reviewed by me and considered in my medical decision making (see chart for details).     Patient here with depression and suicidal thoughts. He is medically  cleared for TTS consultation.   Per TTS consultation, patient will remain for AM psychiatric evaluation.   Final Clinical Impressions(s) / ED Diagnoses   Final diagnoses:  None   1. Depression 2. Alcohol intoxication 3. Passive SI New Prescriptions New Prescriptions   No medications on file     Danne Harbor 11/16/16 0503    Nira Conn, MD 11/17/16 [phone]

## 2016-11-16 ENCOUNTER — Emergency Department (HOSPITAL_COMMUNITY)
Admission: EM | Admit: 2016-11-16 | Discharge: 2016-11-16 | Disposition: A | Discharge status: Home / Self Care | Payer: Self-pay | Attending: Emergency Medicine | Admitting: Emergency Medicine

## 2016-11-16 ENCOUNTER — Encounter (HOSPITAL_COMMUNITY): Payer: Self-pay | Admitting: Emergency Medicine

## 2016-11-16 ENCOUNTER — Emergency Department (HOSPITAL_COMMUNITY): Admission: EM | Admit: 2016-11-16 | Discharge: 2016-11-16 | Discharge status: Absent without leave | Payer: Self-pay

## 2016-11-16 ENCOUNTER — Emergency Department (HOSPITAL_COMMUNITY)
Admission: EM | Admit: 2016-11-16 | Discharge: 2016-11-17 | Payer: Self-pay | Attending: Emergency Medicine | Admitting: Emergency Medicine

## 2016-11-16 DIAGNOSIS — F329 Major depressive disorder, single episode, unspecified: Secondary | ICD-10-CM | POA: Diagnosis present

## 2016-11-16 DIAGNOSIS — F419 Anxiety disorder, unspecified: Secondary | ICD-10-CM | POA: Insufficient documentation

## 2016-11-16 DIAGNOSIS — F32A Depression, unspecified: Secondary | ICD-10-CM | POA: Diagnosis present

## 2016-11-16 DIAGNOSIS — F1010 Alcohol abuse, uncomplicated: Secondary | ICD-10-CM | POA: Diagnosis present

## 2016-11-16 DIAGNOSIS — F4310 Post-traumatic stress disorder, unspecified: Secondary | ICD-10-CM | POA: Insufficient documentation

## 2016-11-16 DIAGNOSIS — E039 Hypothyroidism, unspecified: Secondary | ICD-10-CM | POA: Insufficient documentation

## 2016-11-16 LAB — COMPREHENSIVE METABOLIC PANEL
ALBUMIN: 4.7 g/dL (ref 3.5–5.0)
ALK PHOS: 55 U/L (ref 38–126)
ALT: 24 U/L (ref 17–63)
AST: 29 U/L (ref 15–41)
Anion gap: 11 (ref 5–15)
BUN: 18 mg/dL (ref 6–20)
CALCIUM: 9.8 mg/dL (ref 8.9–10.3)
CO2: 28 mmol/L (ref 22–32)
CREATININE: 1.17 mg/dL (ref 0.61–1.24)
Chloride: 104 mmol/L (ref 101–111)
GFR calc non Af Amer: >60 mL/min (ref >60)
GLUCOSE: 101 mg/dL — AB (ref 65–99)
Potassium: 4.6 mmol/L (ref 3.5–5.1)
SODIUM: 143 mmol/L (ref 135–145)
Total Bilirubin: 0.8 mg/dL (ref 0.3–1.2)
Total Protein: 8.2 g/dL — ABNORMAL HIGH (ref 6.5–8.1)

## 2016-11-16 LAB — CBC WITH DIFFERENTIAL/PLATELET
Basophils Absolute: 0 10*3/uL (ref 0.0–0.1)
Basophils Relative: 0 %
EOS ABS: 0 10*3/uL (ref 0.0–0.7)
EOS PCT: 0 %
HCT: 42.7 % (ref 39.0–52.0)
Hemoglobin: 14.8 g/dL (ref 13.0–17.0)
LYMPHS ABS: 2.1 10*3/uL (ref 0.7–4.0)
Lymphocytes Relative: 21 %
MCH: 30.6 pg (ref 26.0–34.0)
MCHC: 34.7 g/dL (ref 30.0–36.0)
MCV: 88.4 fL (ref 78.0–100.0)
MONOS PCT: 6 %
Monocytes Absolute: 0.6 10*3/uL (ref 0.1–1.0)
Neutro Abs: 7.5 10*3/uL (ref 1.7–7.7)
Neutrophils Relative %: 73 %
PLATELETS: 189 10*3/uL (ref 150–400)
RBC: 4.83 MIL/uL (ref 4.22–5.81)
RDW: 13.6 % (ref 11.5–15.5)
WBC: 10.3 10*3/uL (ref 4.0–10.5)

## 2016-11-16 LAB — RAPID URINE DRUG SCREEN, HOSP PERFORMED
Amphetamines: NOT DETECTED
BENZODIAZEPINES: POSITIVE — AB
Barbiturates: NOT DETECTED
Cocaine: NOT DETECTED
Opiates: POSITIVE — AB
Tetrahydrocannabinol: NOT DETECTED

## 2016-11-16 LAB — ETHANOL: Alcohol, Ethyl (B): <5 mg/dL (ref <5)

## 2016-11-16 MED ORDER — DIAZEPAM 5 MG PO TABS
10.0000 mg | ORAL_TABLET | Freq: Two times a day (BID) | ORAL | Status: DC | PRN
  Administered 2016-11-16: 10 mg via ORAL
  Filled 2016-11-16: qty 2

## 2016-11-16 MED ORDER — IBUPROFEN 200 MG PO TABS: 600.0000 mg | ORAL_TABLET | Freq: Three times a day (TID) | ORAL | Status: DC | PRN

## 2016-11-16 MED ORDER — LORAZEPAM 1 MG PO TABS
1.0000 mg | ORAL_TABLET | Freq: Once | ORAL | Status: AC
  Administered 2016-11-16: 1 mg via ORAL
  Filled 2016-11-16: qty 1

## 2016-11-16 NOTE — Progress Notes (Signed)
Contacts: Kathrene AluRonald Leeb (Father) [phone] Robyne AskewJohanna Lawrence (Sister) [phone] Bonita QuinLinda (Family Friend) [phone]  Princess BruinsAquicha Siler Mavis, MSW, LCSWA TTS Specialist [phone]

## 2016-11-16 NOTE — ED Provider Notes (Signed)
[facility]-EMER[facility]ENCY DEPT Provider Note   CSN: [id number] Arrival date & time: 11/16/16  1552     History   Chief Complaint Chief Complaint  Patient presents with  . detox    hydrocodone   . Anxiety  . Medical Clearance    HPI Gregory Bush is a 36 y.o. male.  He is here for evaluation of his PTSD, and anxiety.  He wants to be referred to a treatment center, in [place].  He was in the ED yesterday, observed overnight, seen by psychiatry and discharged.  He came back almost immediately after discharge, because of feeling panicky.  He was subsequently discharged again.  He is now here with family members, who are supportive and are trying to help get him into the above-referenced facility.  He denies nausea, vomiting, chest pain, weakness or dizziness.  He is feeling shaky because he has not had his Valium for 2 days.  He states the prescription is back in [place], where he was living before his family members brought him here.  He is on Valium for "anxiety."  He has long standing PTSD, and at one point had a seizure, causing a jaw fracture, and has had chronic pain in his jaw since then, requiring tramadol.  There are no other known modifying factors.  HPI  Past Medical History:  Diagnosis Date  . Anxiety   . Hypothyroidism   . IBS (irritable bowel syndrome)   . Jaw fracture (HCC)   . Migraines   . Seizures [facility](HCC)     Patient Active Problem List   Diagnosis Date Noted  . Alcohol abuse 11/16/2016  . Anxiety and depression 11/16/2016  . AP (abdominal pain) 04/07/2014  . Abnormal CT scan 04/07/2014  . Loss of weight 04/07/2014  . ACUTE GASTRITIS WITHOUT MENTION OF HEMORRHAGE 11/06/2009  . DIARRHEA 10/30/2009  . UNSPECIFIED HYPOTHYROIDISM 10/29/2009  . HYPERCHOLESTEROLEMIA 10/29/2009  . ANXIETY 10/29/2009  . MICROSCOPIC HEMATURIA 10/29/2009    Past Surgical History:  Procedure Laterality Date  . COLONOSCOPY W/ BIOPSIES    . ESOPHAGOGASTRODUODENOSCOPY    . EYE  MUSCLE SURGERY Bilateral 1985  . HIP SURGERY    . MANDIBLE SURGERY         Home Medications    Prior to Admission medications   Medication Sig Start Date End Date Taking? Authorizing Provider  diazepam (VALIUM) 10 MG tablet Take 1 tablet (10 mg total) by mouth every 12 (twelve) hours as needed for anxiety. 10/30/16  Yes Defelice, Para MarchJeanette, NP  HYDROcodone-acetaminophen (NORCO) 10-325 MG tablet Take 1 tablet by mouth every 6 (six) hours as needed for moderate pain or severe pain.   Yes [provider]  ibuprofen (ADVIL,MOTRIN) 200 MG tablet Take 600 mg by mouth every 8 (eight) hours as needed for moderate pain.    Yes [provider]    Family History Family History  Problem Relation Age of Onset  . Breast cancer Mother   . Pancreatic cancer Mother   . Diabetes Mother   . Heart disease Mother   . Prostate cancer Father   . Heart failure Maternal Grandmother     Social History Social History  Substance Use Topics  . Smoking status: Never Smoker  . Smokeless tobacco: Never Used  . Alcohol use 0.0 oz/week     Comment: Did drink today but normally does not drink. Drunk today to ease the pain. Last drink: 13:00pm. Drunk Vodka.      Allergies   Tolmetin  Review of Systems Review of Systems  All other systems reviewed and are negative.    Physical Exam Updated Vital Signs BP (!) 145/85 (BP Location: Left Arm)   Pulse 94   Temp 98.1 F (36.7 C) (Oral)   Resp 16   Wt 84.4 kg (186 lb)   SpO2 99%   BMI 28.28 kg/m   Physical Exam  Constitutional: He is oriented to person, place, and time. He appears well-developed and well-nourished.  HENT:  Head: Normocephalic and atraumatic.  Right Ear: External ear normal.  Left Ear: External ear normal.  Eyes: Pupils are equal, round, and reactive to light. Conjunctivae and EOM are normal.  Neck: Normal range of motion and phonation normal. Neck supple.  Cardiovascular: Normal rate, regular rhythm and normal  heart sounds.   Pulmonary/Chest: Effort normal and breath sounds normal. He exhibits no bony tenderness.  Abdominal: Soft. There is no tenderness.  Musculoskeletal: Normal range of motion.  Neurological: He is alert and oriented to person, place, and time. No cranial nerve deficit or sensory deficit. He exhibits normal muscle tone. Coordination normal.  Skin: Skin is warm, dry and intact.  Psychiatric: His behavior is normal. Judgment and thought content normal.  Mild anxiousness.  Nursing note and vitals reviewed.    ED Treatments / Results  Labs (all labs ordered are listed, but only abnormal results are displayed) Labs Reviewed  COMPREHENSIVE METABOLIC PANEL  ETHANOL  RAPID URINE DRUG SCREEN, HOSP PERFORMED  CBC WITH DIFFERENTIAL/PLATELET    EKG  EKG Interpretation None       Radiology No results found.  Procedures Procedures (including critical care time)  Medications Ordered in ED Medications - No data to display   Initial Impression / Assessment and Plan / ED Course  I have reviewed the triage vital signs and the nursing notes.  Pertinent labs & imaging results that were available during my care of the patient were reviewed by me and considered in my medical decision making (see chart for details).      Patient Vitals for the past 24 hrs:  BP Temp Temp src Pulse Resp SpO2 Weight  11/16/16 2143 (!) 145/85 - - 94 16 99 % -  11/16/16 1629 (!) 146/86 98.1 F (36.7 C) Oral 93 18 100 % 84.4 kg (186 lb)   I asked TTS, to evaluate the patient to see if they would be able to give him a referral as requested.    11:45 PM Reevaluation with update and discussion. After initial assessment and treatment, an updated evaluation reveals no change in clinical status.  Findings discussed with patient, all questions answered. Mouhamad Teed L   Final Clinical Impressions(s) / ED Diagnoses   Final diagnoses:  Posttraumatic stress disorder  Anxiety   Psychiatric  illnesses without acute suicidal or homicidal ideation.  Nursing Notes Reviewed/ Care Coordinated Applicable Imaging Reviewed Interpretation of Laboratory Data incorporated into ED treatment  Plan:-As per TTS in conjunction with oncoming provider team  New Prescriptions New Prescriptions   No medications on file     Mancel Bale, MD 11/16/16 2347

## 2016-11-16 NOTE — ED Notes (Signed)
Chaplain contacted to get clothes for patient as parents took them to [place] with them accidentally.

## 2016-11-16 NOTE — ED Triage Notes (Signed)
Pt discharged approximately 20 minutes ago; upon discharge realized family member took belongings; pt continues to verbalize started having panic attack knowing all belongings were not here. Pt denies chest pain; verbalizes chronic jaw pain.

## 2016-11-16 NOTE — ED Notes (Signed)
Pt [facility]  [place] [facility] phone# [phone] Fax [phone] Director is Hansel FeinsteinBen Bush , for management of depression and anxiety r/t PTSD. [name] states he and his parents called and spoke with staff at [facility] who told them they need a referral to be admitted to the facility. Pt denies drug or ETOH use, denies SI/HI.

## 2016-11-16 NOTE — ED Notes (Signed)
Melvenia BeamShari, PA-C informed of pt's request.
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
## 2016-11-16 NOTE — ED Provider Notes (Signed)
[facility] DEPT Provider Note   CSN: [id number] Arrival date & time: 11/16/16  1240  By signing my name below, I, Cynda Acres, attest that this documentation has been prepared under the direction and in the presence of Karne Ozga, PA-C. Electronically Signed: Cynda Acres, Scribe. 11/16/16. 1:05 PM.  History   Chief Complaint Chief Complaint  Patient presents with  . Anxiety    HPI Comments: Gregory Bush is a 36 y.o. male with a history of anxiety and depression, who presents to the Emergency Department complaining of sudden-onset anxiety that occurred several minutes ago. Patient was just released from the hospital for a psych evaluation just 20 min ago. Patient states that he told the providers that he was not ready to leave yet. Patient states he walked outside and began feeling anxious. Patient states his symptoms are similar to prior anxiety attacks, he states his last panic attack caused him to break his jaw and "break out in a seizure". Patient states he is upset, because his family took his belongings with him and headed back to [place]. He states his family is on the way back, but are three hours away. No additional symptoms noted. Patient denies any HI or SI at this time. Patient denies any fever, chills, nausea, vomiting, or chest pain.   The history is provided by the patient. No language interpreter was used.    Past Medical History:  Diagnosis Date  . Anxiety   . Hypothyroidism   . IBS (irritable bowel syndrome)   . Jaw fracture (HCC)   . Migraines   . Seizures [facility])     Patient Active Problem List   Diagnosis Date Noted  . Alcohol abuse 11/16/2016  . Anxiety and depression 11/16/2016  . AP (abdominal pain) 04/07/2014  . Abnormal CT scan 04/07/2014  . Loss of weight 04/07/2014  . ACUTE GASTRITIS WITHOUT MENTION OF HEMORRHAGE 11/06/2009  . DIARRHEA 10/30/2009  . UNSPECIFIED HYPOTHYROIDISM 10/29/2009  . HYPERCHOLESTEROLEMIA 10/29/2009  . ANXIETY  10/29/2009  . MICROSCOPIC HEMATURIA 10/29/2009    Past Surgical History:  Procedure Laterality Date  . COLONOSCOPY W/ BIOPSIES    . ESOPHAGOGASTRODUODENOSCOPY    . EYE MUSCLE SURGERY Bilateral 1985  . HIP SURGERY    . MANDIBLE SURGERY         Home Medications    Prior to Admission medications   Medication Sig Start Date End Date Taking? Authorizing Provider  diazepam (VALIUM) 10 MG tablet Take 1 tablet (10 mg total) by mouth every 12 (twelve) hours as needed for anxiety. 10/30/16   Defelice, Para March, NP  HYDROcodone-acetaminophen (NORCO) 10-325 MG tablet Take 1 tablet by mouth every 6 (six) hours as needed for moderate pain or severe pain.    [provider]  ibuprofen (ADVIL,MOTRIN) 200 MG tablet Take 600 mg by mouth every 8 (eight) hours as needed for moderate pain.     [provider]    Family History Family History  Problem Relation Age of Onset  . Breast cancer Mother   . Pancreatic cancer Mother   . Diabetes Mother   . Heart disease Mother   . Prostate cancer Father   . Heart failure Maternal Grandmother     Social History Social History  Substance Use Topics  . Smoking status: Never Smoker  . Smokeless tobacco: Never Used  . Alcohol use 0.0 oz/week     Comment: Did drink today but normally does not drink. Drunk today to ease the pain. Last drink: 13:00pm. Drunk  Vodka.      Allergies   Tolmetin   Review of Systems Review of Systems  Constitutional: Negative for chills and fever.  Respiratory: Positive for shortness of breath.   Cardiovascular: Negative for chest pain.  Gastrointestinal: Negative for nausea and vomiting.  Psychiatric/Behavioral: Negative for hallucinations and suicidal ideas.     Physical Exam Updated Vital Signs BP (!) 147/94 (BP Location: Right Arm)   Pulse (!) 107   Temp 97.8 F (36.6 C) (Oral)   Resp 20   SpO2 97%   Physical Exam  Constitutional: He is oriented to person, place, and time. He appears  well-developed.  HENT:  Head: Normocephalic and atraumatic.  Mouth/Throat: Oropharynx is clear and moist.  Eyes: Pupils are equal, round, and reactive to light. Conjunctivae and EOM are normal.  Neck: Normal range of motion. Neck supple.  Cardiovascular: Normal rate.   Pulmonary/Chest: Effort normal.  Abdominal: Soft. Bowel sounds are normal.  Neurological: He is alert and oriented to person, place, and time.  Skin: Skin is warm and dry.  Psychiatric: He has a normal mood and affect.  Anxious appearing  Nursing note and vitals reviewed.    ED Treatments / Results  DIAGNOSTIC STUDIES: Oxygen Saturation is 97% on RA, normal by my interpretation.    COORDINATION OF CARE: 1:04 PM Discussed treatment plan with pt at bedside and pt agreed to plan, which includes ativan.   Labs (all labs ordered are listed, but only abnormal results are displayed) Labs Reviewed - No data to display  EKG  EKG Interpretation None       Radiology No results found.  Procedures Procedures (including critical care time)  Medications Ordered in ED Medications - No data to display   Initial Impression / Assessment and Plan / ED Course  I have reviewed the triage vital signs and the nursing notes.  Pertinent labs & imaging results that were available during my care of the patient were reviewed by me and considered in my medical decision making (see chart for details).     Patient just discharged 20 minutes ago, he checked back in because once he went outside and he started feeling panicky and wanted to come back for reevaluation. He denies suicidal homicidal ideations. He is upset because he does not have his belongings are his medications, however his family is on the way. He appears to be rational, however is anxious. I will give him an Ativan and reassess.     Patient is feeling better. He is requesting prescription, however explained that we do not prescribe psychiatric medications. He'll  be discharged home with his family.    Vitals:   11/16/16 1248  BP: (!) 147/94  Pulse: (!) 107  Resp: 20  Temp: 97.8 F (36.6 C)  TempSrc: Oral  SpO2: 97%     Final Clinical Impressions(s) / ED Diagnoses   Final diagnoses:  Anxiety    New Prescriptions Discharge Medication List as of 11/16/2016  1:51 PM     I personally performed the services described in this documentation, which was scribed in my presence. The recorded information has been reviewed and is accurate.     Jaynie CrumbleKirichenko, Nathali Vent, PA-C 11/16/16 1400    Derwood KaplanNanavati, Ankit, MD 11/17/16 [phone]

## 2016-11-16 NOTE — ED Triage Notes (Addendum)
Pt from home with seeking re-assessment for detox and anxiety. Pt states he found a place where he can be placed for longer term treatment in [place], but they stated he needs a referral from a social worker in the ED. Pt states he was here last night and this morning for the same Pt last took hydrocodone 2 days ago and last took valium last night. Pt denies SI/HI/AVH

## 2016-11-16 NOTE — ED Notes (Signed)
TTS assessment in progress. 

## 2016-11-16 NOTE — ED Notes (Signed)
Pt refused ibuprofen stating "it hurts my stomach, so I'd rather not."

## 2016-11-16 NOTE — Discharge Instructions (Signed)
Please follow up as needed 
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
## 2016-11-16 NOTE — ED Notes (Signed)
Ekg delayed per EDP request.

## 2016-11-16 NOTE — ED Notes (Signed)
Pt stated "What about my medicines?  My valium I take for sleep and vicodin from my jaw surgery?"  Informed will speak to PA-C.  Pt verbalized understanding.

## 2016-11-16 NOTE — ED Notes (Signed)
Pt called for triage with no response. 

## 2016-11-16 NOTE — BHH Suicide Risk Assessment (Signed)
Suicide Risk Assessment  Discharge Assessment   [facility]   Principal Problem: Anxiety and depression Discharge Diagnoses:  Patient Active Problem List   Diagnosis Date Noted  . Alcohol abuse [F10.10] 11/16/2016    Priority: High  . Anxiety and depression [F41.9, F32.9] 11/16/2016    Priority: High  . AP (abdominal pain) [R10.9] 04/07/2014  . Abnormal CT scan [R93.8] 04/07/2014  . Loss of weight [R63.4] 04/07/2014  . ACUTE GASTRITIS WITHOUT MENTION OF HEMORRHAGE [K29.00] 11/06/2009  . DIARRHEA [R19.7] 10/30/2009  . UNSPECIFIED HYPOTHYROIDISM [E03.9] 10/29/2009  . HYPERCHOLESTEROLEMIA [E78.00] 10/29/2009  . ANXIETY [F41.1] 10/29/2009  . MICROSCOPIC HEMATURIA [R31.29] 10/29/2009    Total Time spent with patient: 45 minutes  Musculoskeletal: Strength & Muscle Tone: within normal limits Gait & Station: normal Patient leans: N/A  Psychiatric Specialty Exam:   Blood pressure 114/64, pulse 72, temperature 97.8 F (36.6 C), temperature source Oral, resp. rate 16, height 5\' 8"  (1.727 m), weight 80.7 kg (178 lb), SpO2 97 %.Body mass index is 27.06 kg/m.  General Appearance: Casual  Eye Contact::  Good  Speech:  Normal Rate409  Volume:  Normal  Mood:  Anxious  Affect:  Congruent  Thought Process:  Coherent and Descriptions of Associations: Intact  Orientation:  Full (Time, Place, and Person)  Thought Content:  WDL and Logical  Suicidal Thoughts:  No  Homicidal Thoughts:  No  Memory:  Immediate;   Good Recent;   Good Remote;   Good  Judgement:  Fair  Insight:  Fair  Psychomotor Activity:  Normal  Concentration:  Good  Recall:  Good  Fund of Knowledge:Good  Language: Good  Akathisia:  No  Handed:  Right  AIMS (if indicated):     Assets:  Housing Leisure Time Physical Health Resilience Social Support  Sleep:     Cognition: WNL  ADL's:  Intact   Mental Status Per Nursing Assessment::   On Admission:   alcohol abuse with increase in  depression and anxiety.  No suicidal ideations or past attempts, no homicidal ideations.  No psychosis.  He is interested in outpatient resources for his anxiety, lives with his family.  Resources provided, stable for discharge.  Demographic Factors:  Male and Caucasian  Loss Factors: NA  Historical Factors: NA  Risk Reduction Factors:   Sense of responsibility to family, Living with another person, especially a relative and Positive social support  Continued Clinical Symptoms:  Depression and anxiety, mild  Cognitive Features That Contribute To Risk:  None    Suicide Risk:  Minimal: No identifiable suicidal ideation.  Patients presenting with no risk factors but with morbid ruminations; may be classified as minimal risk based on the severity of the depressive symptoms  Follow-up Information    [facility] Follow up.   Specialty:  Professional Counselor Why:  Please follow-up and bring discharge information.  Contact information: [facility] [address] [place] [address] [phone]           Plan Of Care/Follow-up recommendations:  Activity:  as tolerated Diet:  heart healthy diet  Emileigh Kellett, NP 11/16/2016, 11:08 AM

## 2016-11-17 DIAGNOSIS — F1910 Other psychoactive substance abuse, uncomplicated: Secondary | ICD-10-CM

## 2016-11-17 DIAGNOSIS — F329 Major depressive disorder, single episode, unspecified: Secondary | ICD-10-CM

## 2016-11-17 DIAGNOSIS — F4310 Post-traumatic stress disorder, unspecified: Secondary | ICD-10-CM

## 2016-11-17 DIAGNOSIS — F1010 Alcohol abuse, uncomplicated: Secondary | ICD-10-CM

## 2016-11-17 DIAGNOSIS — F419 Anxiety disorder, unspecified: Secondary | ICD-10-CM

## 2016-11-17 MED ORDER — LORAZEPAM 2 MG/ML IJ SOLN: 0.0000 mg | Freq: Four times a day (QID) | INTRAMUSCULAR | Status: DC

## 2016-11-17 MED ORDER — LORAZEPAM 1 MG PO TABS: 0.0000 mg | ORAL_TABLET | Freq: Two times a day (BID) | ORAL | Status: DC

## 2016-11-17 MED ORDER — LORAZEPAM 1 MG PO TABS
0.0000 mg | ORAL_TABLET | Freq: Four times a day (QID) | ORAL | Status: DC
  Administered 2016-11-17: 1 mg via ORAL
  Filled 2016-11-17: qty 2

## 2016-11-17 MED ORDER — LORAZEPAM 2 MG/ML IJ SOLN
0.0000 mg | Freq: Two times a day (BID) | INTRAMUSCULAR | Status: DC
Start: 2016-11-19 — End: 2016-11-17

## 2016-11-17 MED ORDER — THIAMINE HCL 100 MG/ML IJ SOLN: 100.0000 mg | Freq: Every day | INTRAMUSCULAR | Status: DC

## 2016-11-17 MED ORDER — CHLORDIAZEPOXIDE HCL 25 MG PO CAPS
100.0000 mg | ORAL_CAPSULE | Freq: Once | ORAL | Status: AC
  Administered 2016-11-17: 100 mg via ORAL
  Filled 2016-11-17: qty 4

## 2016-11-17 MED ORDER — GI COCKTAIL ~~LOC~~
30.0000 mL | Freq: Once | ORAL | Status: AC
  Administered 2016-11-17: 30 mL via ORAL
  Filled 2016-11-17 (×2): qty 30

## 2016-11-17 MED ORDER — VITAMIN B-1 100 MG PO TABS
100.0000 mg | ORAL_TABLET | Freq: Every day | ORAL | Status: DC
  Administered 2016-11-17: 100 mg via ORAL
  Filled 2016-11-17: qty 1

## 2016-11-17 NOTE — ED Notes (Signed)
Bed: WA27 Expected date:  Expected time:  Means of arrival:  Comments: 

## 2016-11-17 NOTE — ED Notes (Signed)
Pt stated "yesterday they gave me ativan for anxiety because they said it was dangerous to detox off valium because it could cause seizures.  Will you ask the doctor if I can have something?"

## 2016-11-17 NOTE — Progress Notes (Signed)
CSW spoke with patients father and sister via phone regarding questions [facility]. Family or provider has not yet completed application for treatment center and did not have much information regarding admission. CSW informed family that patient is currently medically/ psych cleared at this time and application will not be completed from ED. CSW informed family and patient that [name] would be provided. At discharge, patient is able to go to their walk-in clinic to be evaluated for substance abuse treatment. Sister appreciated CSW and father will pick patient up at discharge.   Stacy GardnerErin Terrace Fontanilla, [facility] Emergency Room Clinical Social Worker [phone]

## 2016-11-17 NOTE — BH Assessment (Signed)
BHH Assessment Progress Note  Per Mojeed Akintayo, MD, this pt does not require psychiatric hospitalization at this time.  [facility] with referral information for area substance abuse treatment providers.  This has been included in pt's discharge instructions.  Pt's nurse has been notified.  Sourish Allender, MA Triage Specialist [phone]     

## 2016-11-17 NOTE — ED Notes (Signed)
Pt c/o abd pain andd "feeling bad".  Dr. Adela LankFloyd informed.

## 2016-11-17 NOTE — BHH Suicide Risk Assessment (Signed)
Suicide Risk Assessment  Discharge Assessment   [facility]   Principal Problem: Posttraumatic stress disorder Discharge Diagnoses:  Patient Active Problem List   Diagnosis Date Noted  . Posttraumatic stress disorder [F43.10] 11/17/2016    Priority: High  . Alcohol abuse [F10.10] 11/16/2016    Priority: High  . Anxiety and depression [F41.9, F32.9] 11/16/2016    Priority: High  . AP (abdominal pain) [R10.9] 04/07/2014  . Abnormal CT scan [R93.8] 04/07/2014  . Loss of weight [R63.4] 04/07/2014  . ACUTE GASTRITIS WITHOUT MENTION OF HEMORRHAGE [K29.00] 11/06/2009  . DIARRHEA [R19.7] 10/30/2009  . UNSPECIFIED HYPOTHYROIDISM [E03.9] 10/29/2009  . HYPERCHOLESTEROLEMIA [E78.00] 10/29/2009  . ANXIETY [F41.1] 10/29/2009  . MICROSCOPIC HEMATURIA [R31.29] 10/29/2009    Total Time spent with patient: 45 minutes Musculoskeletal: Strength & Muscle Tone: within normal limits Gait & Station: normal Patient leans: N/A  Psychiatric Specialty Exam: Physical Exam  Constitutional: He is oriented to person, place, and time. He appears well-developed and well-nourished.  HENT:  Head: Normocephalic.  Neck: Normal range of motion.  Respiratory: Effort normal.  Musculoskeletal: Normal range of motion.  Neurological: He is alert and oriented to person, place, and time.  Psychiatric: He has a normal mood and affect. His speech is normal and behavior is normal. Judgment and thought content normal. Cognition and memory are normal.    Review of Systems  Psychiatric/Behavioral: Positive for substance abuse.  All other systems reviewed and are negative.   Blood pressure 113/77, pulse 79, temperature (!) 97.5 F (36.4 C), temperature source Oral, resp. rate 16, weight 84.4 kg (186 lb), SpO2 98 %.Body mass index is 28.28 kg/m.  General Appearance: Casual  Eye Contact:  Good  Speech:  Normal Rate  Volume:  Normal  Mood:  Euthymic  Affect:  Congruent  Thought Process:   Coherent and Descriptions of Associations: Intact  Orientation:  Full (Time, Place, and Person)  Thought Content:  WDL and Logical  Suicidal Thoughts:  No  Homicidal Thoughts:  No  Memory:  Immediate;   Good Recent;   Good Remote;   Good  Judgement:  Fair  Insight:  Fair  Psychomotor Activity:  Normal  Concentration:  Concentration: Good and Attention Span: Good  Recall:  Good  Fund of Knowledge:  Good  Language:  Good  Akathisia:  No  Handed:  Right  AIMS (if indicated):     Assets:  Housing Leisure Time Physical Health Resilience Social Support  ADL's:  Intact  Cognition:  WNL  Sleep:       Mental Status Per Nursing Assessment::   On Admission:   seeking detox/rehab  Demographic Factors:  Male and Caucasian  Loss Factors: Legal issues  Historical Factors: NA  Risk Reduction Factors:   Sense of responsibility to family and Positive social support  Continued Clinical Symptoms:  None   Cognitive Features That Contribute To Risk:  None    Suicide Risk:  Minimal: No identifiable suicidal ideation.  Patients presenting with no risk factors but with morbid ruminations; may be classified as minimal risk based on the severity of the depressive symptoms    Plan Of Care/Follow-up recommendations:  Activity:  as tolerated Diet:  heart healthy diet  Agata Lucente, NP 11/17/2016, 12:39 PM

## 2016-11-17 NOTE — Discharge Instructions (Signed)
To help you maintain a sober lifestyle, a substance abuse treatment program may be beneficial to you.  Contact one of the following facilities at your earliest opportunity to ask about enrolling:  RESIDENTIAL PROGRAMS:       [facility]      [address]      [place], [place] [address]      [phone]       [facility]      [address]      [place], [place] [address]      [phone]       [facility]      [address]      [place], [place] [address]      [phone]       [facility]      [address]      [place], [place] [address]      [phone]       [facility]      [address]      [place], [place] [address]      [phone]   OUTPATIENT PROGRAMS:       [facility]      [address]      [place], [place] [address]      [phone]      New patients are seen at the walk-in clinic every Tuesday from 9:00 am - 12:00 pm.
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
## 2017-04-27 ENCOUNTER — Ambulatory Visit (HOSPITAL_COMMUNITY)
Admission: EM | Admit: 2017-04-27 | Discharge: 2017-04-27 | Disposition: A | Discharge status: Home / Self Care | Payer: Self-pay | Attending: Emergency Medicine | Admitting: Emergency Medicine

## 2017-04-27 ENCOUNTER — Other Ambulatory Visit: Payer: Self-pay

## 2017-04-27 ENCOUNTER — Encounter (HOSPITAL_COMMUNITY): Payer: Self-pay | Admitting: Emergency Medicine

## 2017-04-27 DIAGNOSIS — K0889 Other specified disorders of teeth and supporting structures: Secondary | ICD-10-CM

## 2017-04-27 MED ORDER — KETOROLAC TROMETHAMINE 30 MG/ML IJ SOLN: 45.0000 mg | Freq: Once | INTRAMUSCULAR | Status: DC

## 2017-04-27 MED ORDER — TRAMADOL HCL 50 MG PO TABS: 50.0000 mg | ORAL_TABLET | Freq: Four times a day (QID) | ORAL | 0 refills | Status: DC | PRN

## 2017-04-27 MED ORDER — KETOROLAC TROMETHAMINE 30 MG/ML IJ SOLN
60.0000 mg | Freq: Once | INTRAMUSCULAR | Status: AC
  Administered 2017-04-27: 45 mg via INTRAMUSCULAR

## 2017-04-27 MED ORDER — KETOROLAC TROMETHAMINE 60 MG/2ML IM SOLN
INTRAMUSCULAR | Status: AC
  Filled 2017-04-27: qty 2

## 2017-04-27 NOTE — ED Triage Notes (Signed)
Pt is having left upper jaw and tooth pain for the last three days. Pt reports breaking his jaw a few years ago and having issues like this ever since that time, just not in this area.

## 2017-04-27 NOTE — ED Provider Notes (Signed)
[facility]    CSN: [id number] Arrival date & time: 04/27/17  1412     History   Chief Complaint Chief Complaint  Patient presents with  . Dental Pain    HPI Lonni Fiximothy Deming is a 36 y.o. male.   36 year old male complaining of pain to the left side of the face he believes it is coming from his tooth for 3 days.  There is some tenderness over the TMJ however he states this is chronic.      Past Medical History:  Diagnosis Date  . Anxiety   . Hypothyroidism   . IBS (irritable bowel syndrome)   . Jaw fracture ([facility])   . Migraines   . Seizures [facility])     Patient Active Problem List   Diagnosis Date Noted  . Posttraumatic stress disorder 11/17/2016  . Alcohol abuse 11/16/2016  . Anxiety and depression 11/16/2016  . AP (abdominal pain) 04/07/2014  . Abnormal CT scan 04/07/2014  . Loss of weight 04/07/2014  . ACUTE GASTRITIS WITHOUT MENTION OF HEMORRHAGE 11/06/2009  . DIARRHEA 10/30/2009  . UNSPECIFIED HYPOTHYROIDISM 10/29/2009  . HYPERCHOLESTEROLEMIA 10/29/2009  . ANXIETY 10/29/2009  . MICROSCOPIC HEMATURIA 10/29/2009    Past Surgical History:  Procedure Laterality Date  . COLONOSCOPY W/ BIOPSIES    . ESOPHAGOGASTRODUODENOSCOPY    . EYE MUSCLE SURGERY Bilateral 1985  . HIP SURGERY    . MANDIBLE SURGERY         Home Medications    Prior to Admission medications   Medication Sig Start Date End Date Taking? Authorizing Provider  ibuprofen (ADVIL,MOTRIN) 200 MG tablet Take 600 mg by mouth every 8 (eight) hours as needed for moderate pain.     [provider]  traMADol (ULTRAM) 50 MG tablet Take 1 tablet (50 mg total) by mouth every 6 (six) hours as needed. 04/27/17   Hayden RasmussenMabe, Sarajean Dessert, NP    Family History Family History  Problem Relation Age of Onset  . Breast cancer Mother   . Pancreatic cancer Mother   . Diabetes Mother   . Heart disease Mother   . Prostate cancer Father   . Heart failure Maternal Grandmother     Social  History Social History   Tobacco Use  . Smoking status: Never Smoker  . Smokeless tobacco: Never Used  Substance Use Topics  . Alcohol use: Yes    Alcohol/week: 0.0 oz    Comment: Did drink today but normally does not drink. Drunk today to ease the pain. Last drink: 13:00pm. Drunk Vodka.   . Drug use: No    Comment: Has documented hx of substance abuse     Allergies   Tolmetin   Review of Systems Review of Systems  Constitutional: Negative.   HENT: Negative for ear pain.   Respiratory: Negative.   Neurological: Positive for facial asymmetry.  All other systems reviewed and are negative.    Physical Exam Triage Vital Signs ED Triage Vitals  Enc Vitals Group     BP 04/27/17 1428 (!) 142/74     Pulse Rate 04/27/17 1428 77     Resp --      Temp 04/27/17 1428 98.4 F (36.9 C)     Temp Source 04/27/17 1428 Oral     SpO2 04/27/17 1428 98 %     Weight --      Height --      Head Circumference --      Peak Flow --      Pain Score  04/27/17 1426 8     Pain Loc --      Pain Edu? --      Excl. in GC? --    No data found.  Updated Vital Signs BP (!) 142/74 (BP Location: Left Arm)   Pulse 77   Temp 98.4 F (36.9 C) (Oral)   SpO2 98%   Visual Acuity Right Eye Distance:   Left Eye Distance:   Bilateral Distance:    Right Eye Near:   Left Eye Near:    Bilateral Near:     Physical Exam  Constitutional: He is oriented to person, place, and time. He appears well-developed and well-nourished.  HENT:  Nose: Nose normal.  No dental tenderness. There is a fractured tooth in the left upper second molar. No swelling of the buccal mucosa. No evidence of abscess. Bile tenderness over the TMJ. No facial swelling. No regional lymphadenopathy.   Neck: Normal range of motion. Neck supple.  Pulmonary/Chest: Effort normal.  Musculoskeletal: Normal range of motion.  Neurological: He is alert and oriented to person, place, and time.  Nursing note and vitals  reviewed.    UC Treatments / Results  Labs (all labs ordered are listed, but only abnormal results are displayed) Labs Reviewed - No data to display  EKG  EKG Interpretation None       Radiology No results found.  Procedures Procedures (including critical care time)  Medications Ordered in UC Medications - No data to display   Initial Impression / Assessment and Plan / UC Course  I have reviewed the triage vital signs and the nursing notes.  Pertinent labs & imaging results that were available during my care of the patient were reviewed by me and considered in my medical decision making (see chart for details).    Take your penicillin that you have at home. Ibuprofen 600 mg every 6 hours. Tylenol every 4 hours. You have 5 tramadol tablets to take for prescription.    Final Clinical Impressions(s) / UC Diagnoses   Final diagnoses:  Pain, dental    ED Discharge Orders        Ordered    traMADol (ULTRAM) 50 MG tablet  Every 6 hours PRN     04/27/17 1612       Controlled Substance Prescriptions [place] Controlled Substance Registry consulted? Not Applicable Yes, done.   Hayden RasmussenMabe, Tammie Yanda, NP 04/27/17 2234

## 2017-04-27 NOTE — Discharge Instructions (Signed)
Take your penicillin that you have at home. Ibuprofen 600 mg every 6 hours. Tylenol every 4 hours. You have 5 tramadol tablets to take for prescription.

## 2017-11-14 IMAGING — CT CT MAXILLOFACIAL W/ CM
3 series · 14 of 33 positions shown, 16 images · IV contrast (Omni 300)
Comparison: 12/16/2014 maxillofacial CT.

CLINICAL DATA: Right facial pain for 3 days. Right mandibular ramus
and right orbital floor/right maxillary sinus fractures on
12/16/2014.

EXAM:
CT MAXILLOFACIAL WITH CONTRAST
TECHNIQUE: Multidetector CT imaging of the maxillofacial structures was
performed with intravenous contrast. Multiplanar CT image
reconstructions were also generated. A small metallic BB was placed
on the right temple in order to reliably differentiate right from
left.
CONTRAST:  75mL OMNIPAQUE IOHEXOL 300 MG/ML  SOLN

[Series 2: neck 2.0 st · axial · 0.38mm/px · z∈[-184,-42]mm · 8 of 85 slices shown, 10 images (1 of 3)]
[im 7/85  soft-tissue]
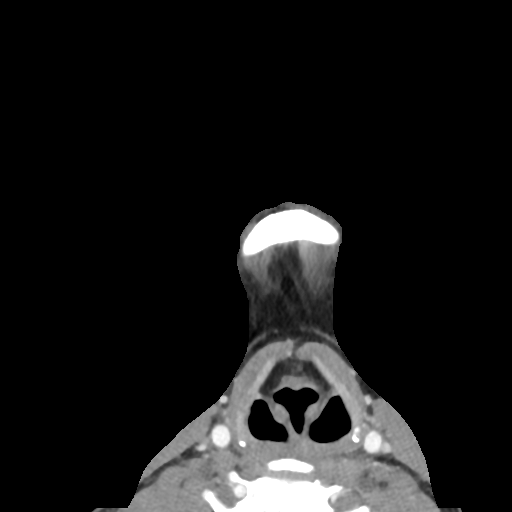
[im 7/85  bone]
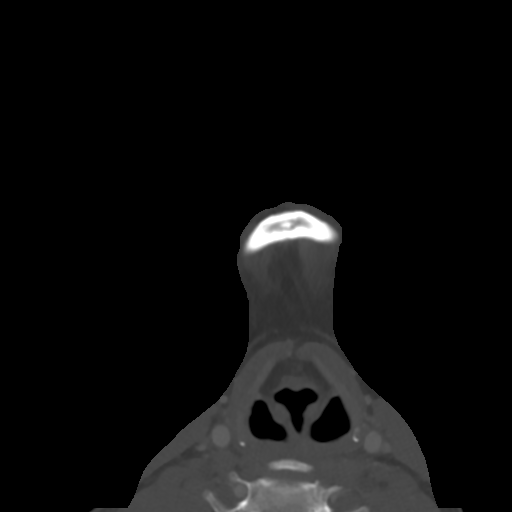
[im 20/85  bone]
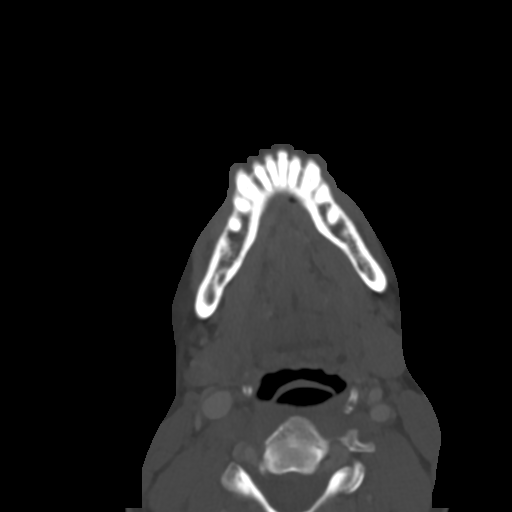
[im 26/85  bone]
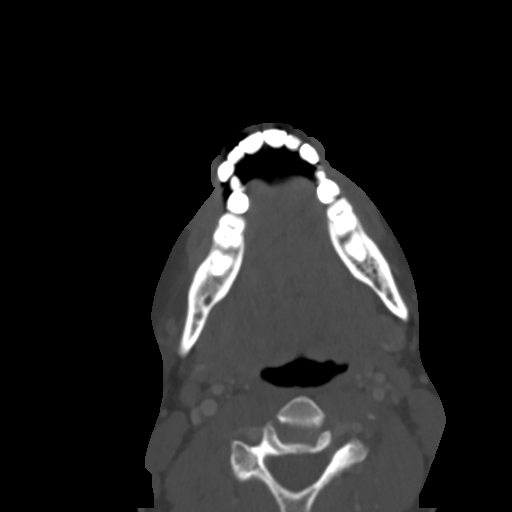
[im 39/85  bone]
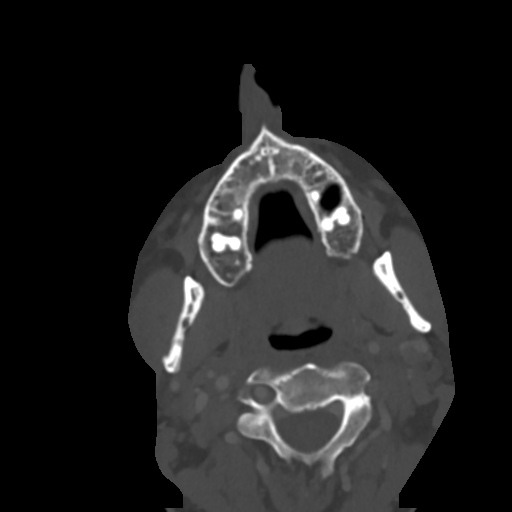
[im 46/85  soft-tissue]
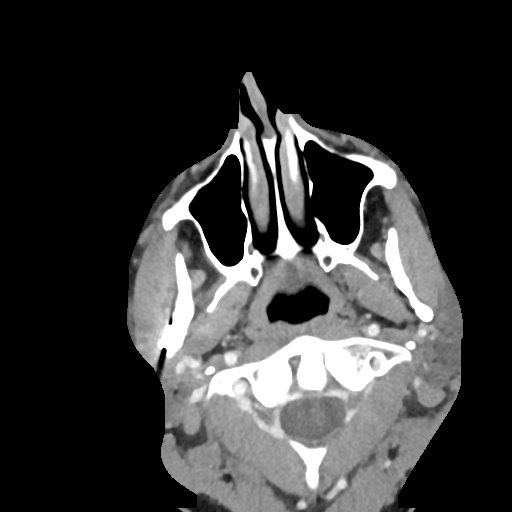
[im 46/85  bone]
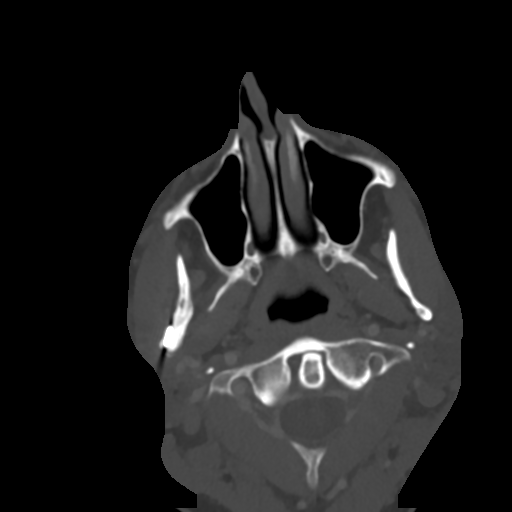
[im 59/85  bone]
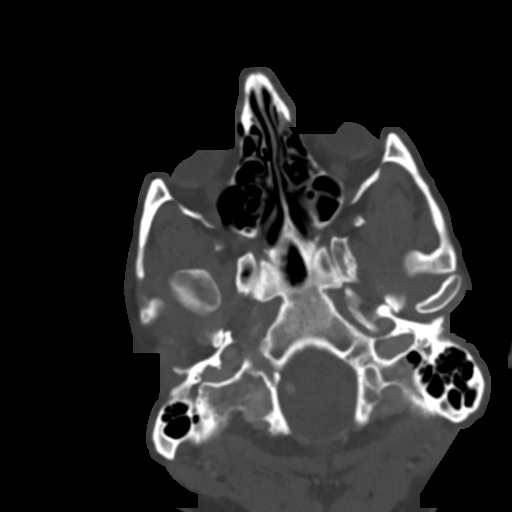
[im 65/85  bone]
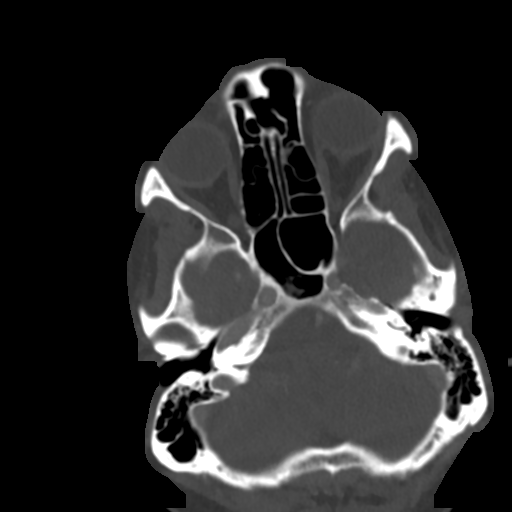
[im 78/85  bone]
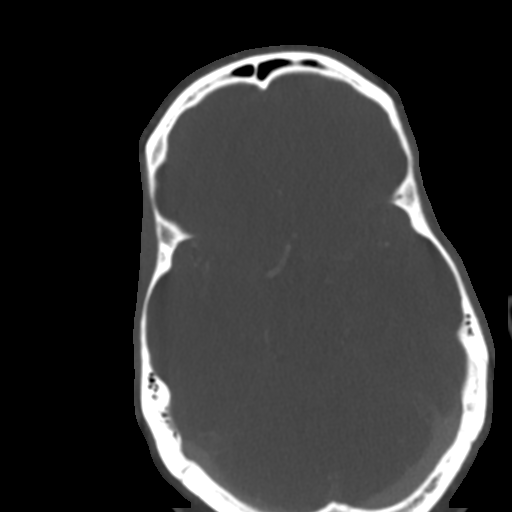

[Series 5: neck 2.0 st · sagittal · 0.35mm/px · 3 of 69 slices shown (2 of 3)]
[im 29/69  bone]
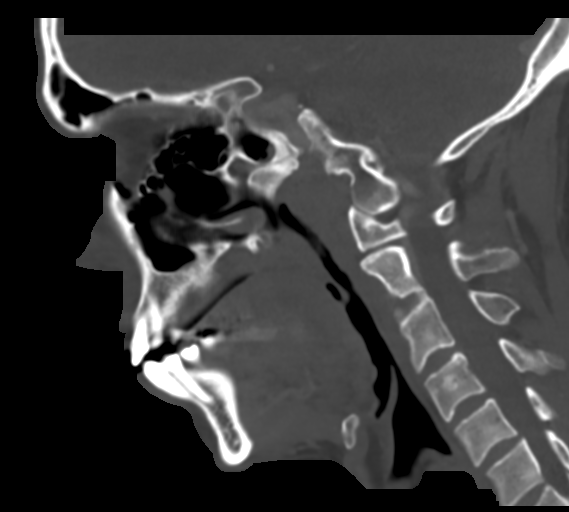
[im 35/69  bone]
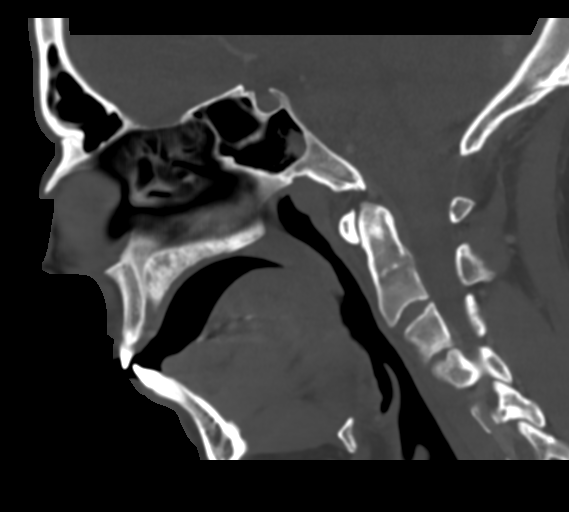
[im 40/69  bone]
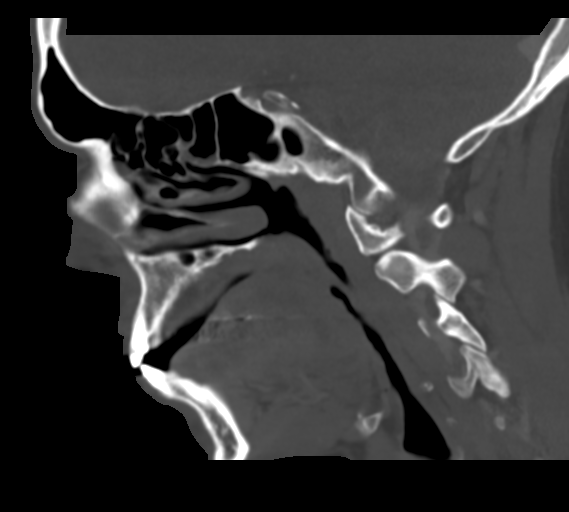

[Series 6: neck 2.0 st · coronal · 0.35mm/px · 3 of 78 slices shown (3 of 3)]
[im 16/78  bone]
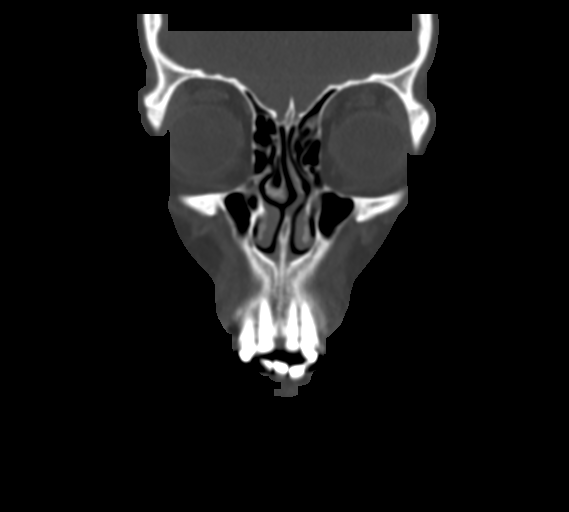
[im 31/78  bone]
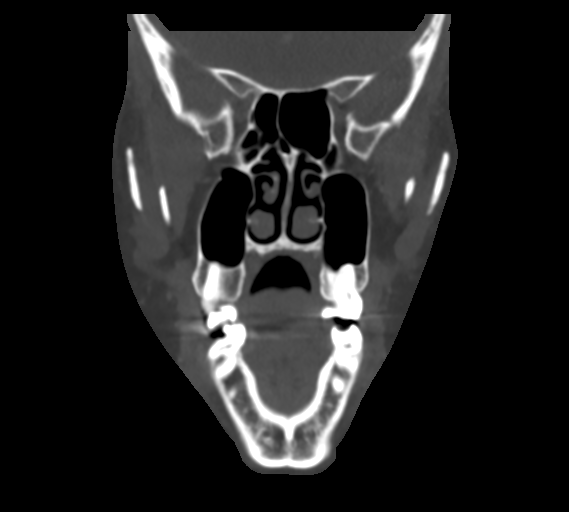
[im 47/78  bone]
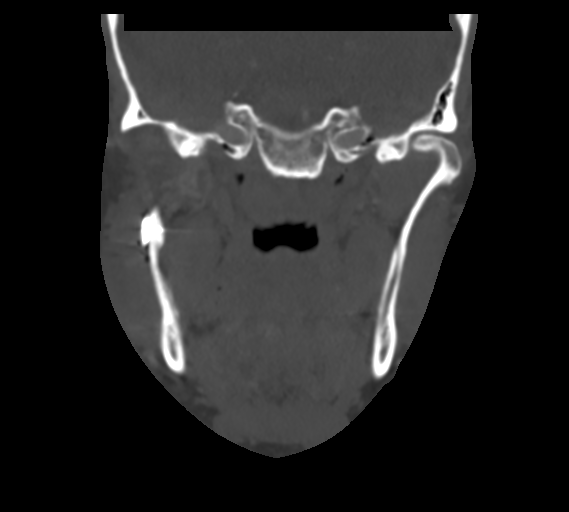

[14 of 33 positions shown; findings below may reference images not displayed]

FINDINGS: There has been interval transfixation of the right proximal
mandibular ramus fracture in near-anatomic alignment by a lateral
surgical plate with multiple interlocking screws and cerclage wire,
with no evidence of hardware fracture or loosening, and with
evidence partial interval healing with bridging endosteal callus
formation. No fluid collections are seen adjacent to the site of
surgical repair.

The right mandibular condyle is anteriorly dislocated at the right
temporomandibular joint. The left mandibular condyle is well
positioned at the left temporomandibular joint.

There has been near complete interval healing of the nondisplaced
lateral right maxillary sinus wall fracture. There has been near
complete interval healing of the nondisplaced posterior right
inferior orbital wall fracture, with no evidence of residual
herniation of fat into the right maxillary sinus or residual
extraocular muscle entrapment.

No acute osseous fracture or osseous erosions. The nasal bones are
unremarkable in appearance. The visualized dentition demonstrates no
acute abnormality. The orbits are intact bilaterally. Partial
opacification of the right sphenoid sinus with frothy secretions.
Minimal opacification of the bilateral posterior ethmoidal air
cells. No fluid levels in the paranasal sinuses. The visualized
mastoid air cells are unopacified. No aggressive appearing focal
osseous lesions.

No significant soft tissue abnormalities are seen. The
parapharyngeal fat planes are preserved. The nasopharynx, oropharynx
and hypopharynx are unremarkable in appearance. The parotid and
submandibular glands are within normal limits. No cervical
lymphadenopathy is seen.
IMPRESSION: 1. Anterior dislocation of the right mandibular condyle at the right
temporomandibular joint.
2. Healing right proximal mandibular ramus fracture in near-anatomic
alignment status post ORIF, with no hardware complication.
3. Near complete healing of the right orbital floor and lateral
right maxillary sinus fractures.
4. Mild posterior paranasal sinusitis.

## 2017-11-24 DIAGNOSIS — R51 Headache: Secondary | ICD-10-CM | POA: Diagnosis not present

## 2018-05-16 DIAGNOSIS — K089 Disorder of teeth and supporting structures, unspecified: Secondary | ICD-10-CM | POA: Diagnosis not present

## 2018-07-10 DIAGNOSIS — K089 Disorder of teeth and supporting structures, unspecified: Secondary | ICD-10-CM | POA: Diagnosis not present

## 2018-09-26 DIAGNOSIS — K047 Periapical abscess without sinus: Secondary | ICD-10-CM | POA: Diagnosis not present

## 2018-10-17 DIAGNOSIS — M79671 Pain in right foot: Secondary | ICD-10-CM | POA: Diagnosis not present

## 2018-11-21 ENCOUNTER — Emergency Department (HOSPITAL_BASED_OUTPATIENT_CLINIC_OR_DEPARTMENT_OTHER)
Admission: EM | Admit: 2018-11-21 | Discharge: 2018-11-21 | Disposition: A | Discharge status: Home / Self Care | Payer: BC Managed Care – PPO | Attending: Emergency Medicine | Admitting: Emergency Medicine

## 2018-11-21 ENCOUNTER — Encounter (HOSPITAL_BASED_OUTPATIENT_CLINIC_OR_DEPARTMENT_OTHER): Payer: Self-pay | Admitting: Emergency Medicine

## 2018-11-21 ENCOUNTER — Other Ambulatory Visit: Payer: Self-pay

## 2018-11-21 DIAGNOSIS — Z20822 Contact with and (suspected) exposure to covid-19: Secondary | ICD-10-CM

## 2018-11-21 DIAGNOSIS — R51 Headache: Secondary | ICD-10-CM | POA: Insufficient documentation

## 2018-11-21 DIAGNOSIS — B349 Viral infection, unspecified: Secondary | ICD-10-CM | POA: Diagnosis not present

## 2018-11-21 DIAGNOSIS — R070 Pain in throat: Secondary | ICD-10-CM | POA: Diagnosis not present

## 2018-11-21 DIAGNOSIS — E039 Hypothyroidism, unspecified: Secondary | ICD-10-CM | POA: Insufficient documentation

## 2018-11-21 DIAGNOSIS — Z20828 Contact with and (suspected) exposure to other viral communicable diseases: Secondary | ICD-10-CM | POA: Insufficient documentation

## 2018-11-21 DIAGNOSIS — M7918 Myalgia, other site: Secondary | ICD-10-CM | POA: Diagnosis not present

## 2018-11-21 NOTE — Discharge Instructions (Addendum)
You were evaluated in the Emergency Department and after careful evaluation, we did not find any emergent condition requiring admission or further testing in the hospital. ° °Your symptoms today may be due to the novel coronavirus.  We have swabbed you in the emergency department, and we should have results back within 2 days.  Until then we recommend home quarantine and following CDC recommendations. ° °Please return to the Emergency Department if you experience any worsening of your condition.  We encourage you to follow up with a primary care provider.  Thank you for allowing us to be a part of your care. °

## 2018-11-21 NOTE — ED Provider Notes (Signed)
[facility] Emergency Department Provider Note MRN:  [id number]  Arrival date & time: 11/21/18     Chief Complaint   Generalized Body Aches   History of Present Illness   Gregory Bush is a 38 y.o. year-old male with no pertinent past medical history presenting to the ED with chief complaint of generalized body aches.  1 day of generalized body aches, subjective fever, sore throat.  Denies chest pain or trouble breathing.  Mild headache.  No nausea or vomiting.  Denies abdominal pain, no diarrhea.  Works in [employer] and 1 of his coworkers recently tested positive for coronavirus.  Has had frequent exposure to this coworker.  Review of Systems  A complete 10 system review of systems was obtained and all systems are negative except as noted in the HPI and PMH.   Patient's Health History    Past Medical History:  Diagnosis Date  . Anxiety   . Hypothyroidism   . IBS (irritable bowel syndrome)   . Jaw fracture (HCC)   . Migraines   . Seizures (HCC)     Past Surgical History:  Procedure Laterality Date  . COLONOSCOPY W/ BIOPSIES    . ESOPHAGOGASTRODUODENOSCOPY    . EYE MUSCLE SURGERY Bilateral 1985  . HIP SURGERY    . MANDIBLE SURGERY      Family History  Problem Relation Age of Onset  . Breast cancer Mother   . Pancreatic cancer Mother   . Diabetes Mother   . Heart disease Mother   . Prostate cancer Father   . Heart failure Maternal Grandmother     Social History   Socioeconomic History  . Marital status: Legally Separated    Spouse name: Not on file  . Number of children: 2  . Years of education: Not on file  . Highest education level: Not on file  Occupational History  . Occupation: self employed  Social Needs  . Financial resource strain: Not on file  . Food insecurity    Worry: Not on file    Inability: Not on file  . Transportation needs    Medical: Not on file    Non-medical: Not on file  Tobacco Use  . Smoking status:  Never Smoker  . Smokeless tobacco: Never Used  Substance and Sexual Activity  . Alcohol use: Not Currently    Alcohol/week: 0.0 standard drinks    Comment: No alcohol since 2018  . Drug use: No    Comment: Has documented hx of substance abuse  . Sexual activity: Not on file  Lifestyle  . Physical activity    Days per week: Not on file    Minutes per session: Not on file  . Stress: Not on file  Relationships  . Social Musicianconnections    Talks on phone: Not on file    Gets together: Not on file    Attends religious service: Not on file    Active member of club or organization: Not on file    Attends meetings of clubs or organizations: Not on file    Relationship status: Not on file  . Intimate partner violence    Fear of current or ex partner: Not on file    Emotionally abused: Not on file    Physically abused: Not on file    Forced sexual activity: Not on file  Other Topics Concern  . Not on file  Social History Narrative  . Not on file     Physical Exam  Vital Signs and Nursing Notes reviewed Vitals:   11/21/18 1205  BP: 131/86  Pulse: 75  Resp: 16  Temp: 98.3 F (36.8 C)  SpO2: 100%    CONSTITUTIONAL: Well-appearing, NAD NEURO:  Alert and oriented x 3, no focal deficits EYES:  eyes equal and reactive ENT/NECK:  no LAD, no JVD CARDIO: Regular rate, well-perfused, normal S1 and S2 PULM:  CTAB no wheezing or rhonchi GI/GU:  normal bowel sounds, non-distended, non-tender MSK/SPINE:  No gross deformities, no edema SKIN:  no rash, atraumatic PSYCH:  Appropriate speech and behavior  Diagnostic and Interventional Summary    Labs Reviewed  NOVEL CORONAVIRUS, NAA (HOSPITAL ORDER, SEND-OUT TO REF LAB)    No orders to display    Medications - No data to display   Procedures Critical Care  ED Course and Medical Decision Making  I have reviewed the triage vital signs and the nursing notes.  Pertinent labs & imaging results that were available during my care of  the patient were reviewed by me and considered in my medical decision making (see below for details).  Mild viral symptoms, possibly coronavirus especially given patient's exposure, no increased work of breathing, lungs clear, swab here in the emergency department, strict return precautions for shortness of breath.  Advised home quarantine.  After the discussed management above, the patient was determined to be safe for discharge.  The patient was in agreement with this plan and all questions regarding their care were answered.  ED return precautions were discussed and the patient will return to the ED with any significant worsening of condition.  Barth Kirks. [place] Small, [place] [email or web address]  Barth Kirks. [place] Small, [place] [email or web address]  Final Clinical Impressions(s) / ED Diagnoses     ICD-10-CM   1. Viral illness  B34.9   2. Exposure to Covid-19 Virus  Z20.828     ED Discharge Orders    None         Maudie Flakes, MD 11/21/18 [phone]

## 2018-11-21 NOTE — ED Triage Notes (Signed)
Coworker tested positive for Covid yesterday and pt is having the same sx this person did.  Body aches all over and  "weird" feeling in his chest.  Sts he is not SOB and does not have pain but "it feels different."

## 2018-11-22 DIAGNOSIS — Z03818 Encounter for observation for suspected exposure to other biological agents ruled out: Secondary | ICD-10-CM | POA: Diagnosis not present

## 2018-11-22 DIAGNOSIS — U071 COVID-19: Secondary | ICD-10-CM | POA: Diagnosis not present

## 2018-11-25 LAB — NOVEL CORONAVIRUS, NAA (HOSP ORDER, SEND-OUT TO REF LAB; TAT 18-24 HRS): SARS-CoV-2, NAA: NOT DETECTED

## 2019-03-20 ENCOUNTER — Other Ambulatory Visit: Payer: Self-pay

## 2019-03-20 DIAGNOSIS — Z20822 Contact with and (suspected) exposure to covid-19: Secondary | ICD-10-CM

## 2019-03-21 LAB — NOVEL CORONAVIRUS, NAA: SARS-CoV-2, NAA: NOT DETECTED

## 2019-05-31 DIAGNOSIS — Z20828 Contact with and (suspected) exposure to other viral communicable diseases: Secondary | ICD-10-CM | POA: Diagnosis not present

## 2019-05-31 DIAGNOSIS — Z03818 Encounter for observation for suspected exposure to other biological agents ruled out: Secondary | ICD-10-CM | POA: Diagnosis not present

## 2019-07-18 DIAGNOSIS — U071 COVID-19: Secondary | ICD-10-CM | POA: Diagnosis not present

## 2019-07-18 DIAGNOSIS — Z20828 Contact with and (suspected) exposure to other viral communicable diseases: Secondary | ICD-10-CM | POA: Diagnosis not present

## 2019-07-19 DIAGNOSIS — Z20828 Contact with and (suspected) exposure to other viral communicable diseases: Secondary | ICD-10-CM | POA: Diagnosis not present

## 2019-07-19 DIAGNOSIS — Z03818 Encounter for observation for suspected exposure to other biological agents ruled out: Secondary | ICD-10-CM | POA: Diagnosis not present

## 2019-07-20 DIAGNOSIS — U071 COVID-19: Secondary | ICD-10-CM | POA: Diagnosis not present

## 2019-07-20 DIAGNOSIS — Z20828 Contact with and (suspected) exposure to other viral communicable diseases: Secondary | ICD-10-CM | POA: Diagnosis not present

## 2019-07-27 DIAGNOSIS — Z20828 Contact with and (suspected) exposure to other viral communicable diseases: Secondary | ICD-10-CM | POA: Diagnosis not present

## 2019-07-27 DIAGNOSIS — Z03818 Encounter for observation for suspected exposure to other biological agents ruled out: Secondary | ICD-10-CM | POA: Diagnosis not present

## 2019-11-25 DIAGNOSIS — M62838 Other muscle spasm: Secondary | ICD-10-CM | POA: Diagnosis not present

## 2019-11-25 DIAGNOSIS — M542 Cervicalgia: Secondary | ICD-10-CM | POA: Diagnosis not present

## 2019-11-25 DIAGNOSIS — Z7689 Persons encountering health services in other specified circumstances: Secondary | ICD-10-CM | POA: Diagnosis not present

## 2019-11-30 DIAGNOSIS — Z03818 Encounter for observation for suspected exposure to other biological agents ruled out: Secondary | ICD-10-CM | POA: Diagnosis not present

## 2019-11-30 DIAGNOSIS — Z20822 Contact with and (suspected) exposure to covid-19: Secondary | ICD-10-CM | POA: Diagnosis not present

## 2021-06-14 DIAGNOSIS — F1911 Other psychoactive substance abuse, in remission: Secondary | ICD-10-CM | POA: Diagnosis not present

## 2021-06-14 DIAGNOSIS — Z7689 Persons encountering health services in other specified circumstances: Secondary | ICD-10-CM | POA: Diagnosis not present

## 2021-08-09 DIAGNOSIS — Z114 Encounter for screening for human immunodeficiency virus [HIV]: Secondary | ICD-10-CM | POA: Diagnosis not present

## 2021-08-09 DIAGNOSIS — Z0000 Encounter for general adult medical examination without abnormal findings: Secondary | ICD-10-CM | POA: Diagnosis not present

## 2021-08-09 DIAGNOSIS — R7301 Impaired fasting glucose: Secondary | ICD-10-CM | POA: Diagnosis not present

## 2021-08-09 DIAGNOSIS — Z125 Encounter for screening for malignant neoplasm of prostate: Secondary | ICD-10-CM | POA: Diagnosis not present

## 2021-08-20 DIAGNOSIS — R7303 Prediabetes: Secondary | ICD-10-CM | POA: Diagnosis not present

## 2021-08-20 DIAGNOSIS — E782 Mixed hyperlipidemia: Secondary | ICD-10-CM | POA: Diagnosis not present

## 2021-08-20 DIAGNOSIS — E663 Overweight: Secondary | ICD-10-CM | POA: Diagnosis not present

## 2021-09-24 DIAGNOSIS — E785 Hyperlipidemia, unspecified: Secondary | ICD-10-CM | POA: Diagnosis not present

## 2021-09-30 DIAGNOSIS — Z0000 Encounter for general adult medical examination without abnormal findings: Secondary | ICD-10-CM | POA: Diagnosis not present

## 2022-03-04 DIAGNOSIS — E785 Hyperlipidemia, unspecified: Secondary | ICD-10-CM | POA: Diagnosis not present

## 2022-03-10 DIAGNOSIS — E782 Mixed hyperlipidemia: Secondary | ICD-10-CM | POA: Diagnosis not present

## 2022-04-11 DIAGNOSIS — R7303 Prediabetes: Secondary | ICD-10-CM | POA: Diagnosis not present

## 2022-04-11 DIAGNOSIS — Z9889 Other specified postprocedural states: Secondary | ICD-10-CM | POA: Diagnosis not present

## 2022-04-26 ENCOUNTER — Emergency Department (HOSPITAL_BASED_OUTPATIENT_CLINIC_OR_DEPARTMENT_OTHER)
Admission: EM | Admit: 2022-04-26 | Discharge: 2022-04-26 | Disposition: A | Discharge status: Home / Self Care | Payer: BC Managed Care – PPO | Attending: Emergency Medicine | Admitting: Emergency Medicine

## 2022-04-26 ENCOUNTER — Emergency Department (HOSPITAL_BASED_OUTPATIENT_CLINIC_OR_DEPARTMENT_OTHER): Payer: BC Managed Care – PPO | Admitting: Radiology

## 2022-04-26 ENCOUNTER — Other Ambulatory Visit: Payer: Self-pay

## 2022-04-26 ENCOUNTER — Encounter (HOSPITAL_BASED_OUTPATIENT_CLINIC_OR_DEPARTMENT_OTHER): Payer: Self-pay | Admitting: Emergency Medicine

## 2022-04-26 DIAGNOSIS — R0789 Other chest pain: Secondary | ICD-10-CM | POA: Insufficient documentation

## 2022-04-26 DIAGNOSIS — R079 Chest pain, unspecified: Secondary | ICD-10-CM

## 2022-04-26 HISTORY — DX: Hyperlipidemia, unspecified: E78.5

## 2022-04-26 LAB — BASIC METABOLIC PANEL
Anion gap: 8 (ref 5–15)
BUN: 19 mg/dL (ref 6–20)
CO2: 28 mmol/L (ref 22–32)
Calcium: 10 mg/dL (ref 8.9–10.3)
Chloride: 102 mmol/L (ref 98–111)
Creatinine, Ser: 1.02 mg/dL (ref 0.61–1.24)
GFR, Estimated: >60 mL/min (ref >60)
Glucose, Bld: 93 mg/dL (ref 70–99)
Potassium: 4 mmol/L (ref 3.5–5.1)
Sodium: 138 mmol/L (ref 135–145)

## 2022-04-26 LAB — CBC
HCT: 45.3 % (ref 39.0–52.0)
Hemoglobin: 15.6 g/dL (ref 13.0–17.0)
MCH: 30.2 pg (ref 26.0–34.0)
MCHC: 34.4 g/dL (ref 30.0–36.0)
MCV: 87.6 fL (ref 80.0–100.0)
Platelets: 170 10*3/uL (ref 150–400)
RBC: 5.17 MIL/uL (ref 4.22–5.81)
RDW: 12.5 % (ref 11.5–15.5)
WBC: 8.1 10*3/uL (ref 4.0–10.5)
nRBC: 0 % (ref 0.0–0.2)

## 2022-04-26 LAB — TROPONIN I (HIGH SENSITIVITY)
Troponin I (High Sensitivity): <2 ng/L (ref <18)
Troponin I (High Sensitivity): 2 ng/L (ref <18)

## 2022-04-26 NOTE — ED Notes (Signed)
Placed on cont cardiac monitoring with cont POX and int NBP assessment

## 2022-04-26 NOTE — ED Notes (Signed)
Here for evaluation of chest pain, onset this past Saturday, states was not doing anything strenous with onset of chest pain. Points to area of left ant chest with radiation to left axilla area.

## 2022-04-26 NOTE — ED Provider Notes (Signed)
[facility] EMERGENCY DEPT Provider Note   CSN: [id number] Arrival date & time: 04/26/22  1128     History  Chief Complaint  Patient presents with   Chest Pain    Gregory Bush is a 41 y.o. male.  Patient here with chest pain on and off for the last few days.  Fairly pinpoint over the left side of the chest.  No history of hypertension, high cholesterol or diabetes.  No specific cough or sputum production.  No trauma history.  Some cardiac history in the family.  No recent travel or surgery.  The history is provided by the patient.       Home Medications Prior to Admission medications   Not on File      Allergies    Tolmetin    Review of Systems   Review of Systems  Physical Exam Updated Vital Signs BP 136/82 (BP Location: Right Arm)   Pulse 70   Temp 97.8 F (36.6 C) (Oral)   Resp (!) 22   Ht 5\' 8"  (1.727 m)   Wt 79.4 kg   SpO2 100%   BMI 26.61 kg/m  Physical Exam Vitals and nursing note reviewed.  Constitutional:      General: He is not in acute distress.    Appearance: He is well-developed.  HENT:     Head: Normocephalic and atraumatic.  Eyes:     Conjunctiva/sclera: Conjunctivae normal.  Cardiovascular:     Rate and Rhythm: Normal rate and regular rhythm.     Heart sounds: No murmur heard. Pulmonary:     Effort: Pulmonary effort is normal. No respiratory distress.     Breath sounds: Normal breath sounds.  Chest:     Chest wall: Tenderness present.  Abdominal:     Palpations: Abdomen is soft.     Tenderness: There is no abdominal tenderness.  Musculoskeletal:        General: No swelling.     Cervical back: Neck supple.  Skin:    General: Skin is warm and dry.     Capillary Refill: Capillary refill takes less than 2 seconds.  Neurological:     Mental Status: He is alert.  Psychiatric:        Mood and Affect: Mood normal.     ED Results / Procedures / Treatments   Labs (all labs ordered are listed, but only abnormal  results are displayed) Labs Reviewed  BASIC METABOLIC PANEL  CBC  TROPONIN I (HIGH SENSITIVITY)  TROPONIN I (HIGH SENSITIVITY)    EKG EKG Interpretation  Date/Time:  Tuesday April 26 2022 11:41:56 EST Ventricular Rate:  88 PR Interval:  132 QRS Duration: 102 QT Interval:  350 QTC Calculation: 423 R Axis:   138 Text Interpretation: Normal sinus rhythm Right axis deviation Confirmed by [id number] ([id number]) on 04/26/2022 3:15:34 PM  Radiology DG Chest 2 View  Result Date: 04/26/2022 CLINICAL DATA:  Chest pain EXAM: CHEST - 2 VIEW COMPARISON:  07/08/2016 FINDINGS: Heart size is normal. Mediastinal shadows are normal. The lungs are clear. No bronchial thickening. No infiltrate, mass, effusion or collapse. Pulmonary vascularity is normal. No bony abnormality. IMPRESSION: Normal chest. Electronically Signed   By: 09/07/2016 M.D.   On: 04/26/2022 12:00    Procedures Procedures    Medications Ordered in ED Medications - No data to display  ED Course/ Medical Decision Making/ A&P  Medical Decision Making Amount and/or Complexity of Data Reviewed Labs: ordered. Radiology: ordered.   Gregory Bush is here with chest pain.  Normal vitals.  No fever.  EKG shows sinus rhythm.  No ischemic changes.  Patient with no cardiac risk factors.  Has already had 2 troponins that are normal.  Have no concern for ACS.  Heart score 0.  PERC negative and doubt PE.  No evidence of pneumonia or pneumothorax on chest x-ray.  Lab work otherwise shows no significant anemia or electrolyte abnormality or kidney injury.  Overall he is got reproducible chest pain.  I suspect that this is muscular in nature.  Discharged in good condition.  Recommend Tylenol ibuprofen.  Recommend follow-up with primary care doctor if persistent.  This chart was dictated using voice recognition software.  Despite best efforts to proofread,  errors can occur which can change the documentation meaning.          Final Clinical Impression(s) / ED Diagnoses Final diagnoses:  Chest pain, unspecified type    Rx / DC Orders ED Discharge Orders     None         Virgina Norfolk, DO 04/26/22 1727

## 2022-04-26 NOTE — ED Triage Notes (Signed)
Pt arrived POV. Pt caox4, ambulatory, and in no obvious distress. Pt c/o mid/left sided CP that is described as dull, 2/10 ongoing for approx 2 days. Pt denies SOB, N/V, dizziness, or any additional complaints.

## 2022-07-16 DIAGNOSIS — K046 Periapical abscess with sinus: Secondary | ICD-10-CM | POA: Diagnosis not present

## 2022-09-23 DIAGNOSIS — K047 Periapical abscess without sinus: Secondary | ICD-10-CM | POA: Diagnosis not present

## 2023-03-07 DIAGNOSIS — R079 Chest pain, unspecified: Secondary | ICD-10-CM | POA: Diagnosis not present

## 2023-03-07 DIAGNOSIS — F1911 Other psychoactive substance abuse, in remission: Secondary | ICD-10-CM | POA: Diagnosis not present

## 2023-03-07 DIAGNOSIS — Z0000 Encounter for general adult medical examination without abnormal findings: Secondary | ICD-10-CM | POA: Diagnosis not present

## 2023-03-09 DIAGNOSIS — Z13220 Encounter for screening for lipoid disorders: Secondary | ICD-10-CM | POA: Diagnosis not present

## 2023-03-09 DIAGNOSIS — Z114 Encounter for screening for human immunodeficiency virus [HIV]: Secondary | ICD-10-CM | POA: Diagnosis not present

## 2023-03-09 DIAGNOSIS — Z125 Encounter for screening for malignant neoplasm of prostate: Secondary | ICD-10-CM | POA: Diagnosis not present

## 2023-03-09 DIAGNOSIS — Z0000 Encounter for general adult medical examination without abnormal findings: Secondary | ICD-10-CM | POA: Diagnosis not present

## 2023-03-21 DIAGNOSIS — Z789 Other specified health status: Secondary | ICD-10-CM | POA: Diagnosis not present

## 2023-03-21 DIAGNOSIS — E782 Mixed hyperlipidemia: Secondary | ICD-10-CM | POA: Diagnosis not present

## 2023-03-21 DIAGNOSIS — R079 Chest pain, unspecified: Secondary | ICD-10-CM | POA: Diagnosis not present

## 2023-03-21 DIAGNOSIS — F410 Panic disorder [episodic paroxysmal anxiety] without agoraphobia: Secondary | ICD-10-CM | POA: Diagnosis not present

## 2023-05-02 DIAGNOSIS — I10 Essential (primary) hypertension: Secondary | ICD-10-CM | POA: Diagnosis not present

## 2023-05-02 DIAGNOSIS — F331 Major depressive disorder, recurrent, moderate: Secondary | ICD-10-CM | POA: Diagnosis not present

## 2023-05-02 DIAGNOSIS — F411 Generalized anxiety disorder: Secondary | ICD-10-CM | POA: Diagnosis not present

## 2023-05-02 DIAGNOSIS — G4700 Insomnia, unspecified: Secondary | ICD-10-CM | POA: Diagnosis not present

## 2023-11-13 ENCOUNTER — Other Ambulatory Visit: Payer: Self-pay

## 2023-11-13 ENCOUNTER — Emergency Department (HOSPITAL_BASED_OUTPATIENT_CLINIC_OR_DEPARTMENT_OTHER)

## 2023-11-13 ENCOUNTER — Emergency Department (HOSPITAL_BASED_OUTPATIENT_CLINIC_OR_DEPARTMENT_OTHER)
Admission: EM | Admit: 2023-11-13 | Discharge: 2023-11-13 | Disposition: A | Discharge status: Home / Self Care | Attending: Emergency Medicine | Admitting: Emergency Medicine

## 2023-11-13 ENCOUNTER — Encounter (HOSPITAL_BASED_OUTPATIENT_CLINIC_OR_DEPARTMENT_OTHER): Payer: Self-pay | Admitting: *Deleted

## 2023-11-13 DIAGNOSIS — K0889 Other specified disorders of teeth and supporting structures: Secondary | ICD-10-CM | POA: Diagnosis present

## 2023-11-13 DIAGNOSIS — E039 Hypothyroidism, unspecified: Secondary | ICD-10-CM | POA: Diagnosis not present

## 2023-11-13 DIAGNOSIS — R110 Nausea: Secondary | ICD-10-CM | POA: Insufficient documentation

## 2023-11-13 DIAGNOSIS — R42 Dizziness and giddiness: Secondary | ICD-10-CM | POA: Insufficient documentation

## 2023-11-13 DIAGNOSIS — R6884 Jaw pain: Secondary | ICD-10-CM | POA: Diagnosis not present

## 2023-11-13 LAB — CBC
HCT: 45.6 % (ref 39.0–52.0)
Hemoglobin: 15.4 g/dL (ref 13.0–17.0)
MCH: 29.6 pg (ref 26.0–34.0)
MCHC: 33.8 g/dL (ref 30.0–36.0)
MCV: 87.5 fL (ref 80.0–100.0)
Platelets: 195 K/uL (ref 150–400)
RBC: 5.21 MIL/uL (ref 4.22–5.81)
RDW: 12.5 % (ref 11.5–15.5)
WBC: 7.2 K/uL (ref 4.0–10.5)
nRBC: 0 % (ref 0.0–0.2)

## 2023-11-13 LAB — BASIC METABOLIC PANEL WITH GFR
Anion gap: 12 (ref 5–15)
BUN: 21 mg/dL — ABNORMAL HIGH (ref 6–20)
CO2: 23 mmol/L (ref 22–32)
Calcium: 10 mg/dL (ref 8.9–10.3)
Chloride: 102 mmol/L (ref 98–111)
Creatinine, Ser: 1.21 mg/dL (ref 0.61–1.24)
GFR, Estimated: >60 mL/min (ref >60)
Glucose, Bld: 106 mg/dL — ABNORMAL HIGH (ref 70–99)
Potassium: 4.3 mmol/L (ref 3.5–5.1)
Sodium: 137 mmol/L (ref 135–145)

## 2023-11-13 LAB — TROPONIN T, HIGH SENSITIVITY: Troponin T High Sensitivity: <15 ng/L (ref <19)

## 2023-11-13 MED ORDER — IOHEXOL 300 MG/ML  SOLN
100.0000 mL | Freq: Once | INTRAMUSCULAR | Status: AC | PRN
  Administered 2023-11-13: 80 mL via INTRAVENOUS

## 2023-11-13 MED ORDER — ONDANSETRON 4 MG PO TBDP: 4.0000 mg | ORAL_TABLET | Freq: Four times a day (QID) | ORAL | 0 refills | Status: AC | PRN

## 2023-11-13 NOTE — ED Triage Notes (Signed)
 Patient to ED reporting lightheadedness, general malaise, nausea and jaw pain x 4 day. Patient reports a hx of tooth problems, pain and swelling that he was placed on an antibiotic 3 days ago for. Pain has subsided with antibiotic use.   No chest pain or shortness of breath reported. No cardiac hx.

## 2023-11-13 NOTE — ED Notes (Addendum)
 Out to CT

## 2023-11-13 NOTE — Discharge Instructions (Signed)
 Please use Tylenol  or ibuprofen  for pain.  You may use 600 mg ibuprofen  every 6 hours or 1000 mg of Tylenol  every 6 hours.  You may choose to alternate between the 2.  This would be most effective.  Not to exceed 4 g of Tylenol  within 24 hours.  Not to exceed 3200 mg ibuprofen  24 hours.  Please take the entire course of antibiotics that you were already prescribed, you can use the nausea medication up to every 6 hours, please make sure that you are drinking plenty of fluids and follow-up closely with your primary care doctor and dentist.

## 2023-11-13 NOTE — ED Provider Notes (Signed)
 [place] EMERGENCY DEPARTMENT AT [facility] Provider Note   CSN: [id number] Arrival date & time: 11/13/23  1059     Patient presents with: Dizziness and Jaw Pain   Gregory Bush is a 43 y.o. male with past medical history significant for hyperlipidemia, hypothyroidism, alcohol abuse, anxiety, depression, PTSD, history of jaw fracture, history of dental disease who presents with concern for some generalized lightheadedness, malaise, nausea, jaw pain for 4 days.  History of significant dental disease, he is following with a dentist for multiple root canals needed in the near future.  Placed on Augmentin 3 days ago.  Dental pain has subsided with antibiotic use but still having some jaw pain and the symptoms as discussed above.  No chest pain, shortness of breath.  No vomiting.  No abdominal pain.    Dizziness      Prior to Admission medications   Medication Sig Start Date End Date Taking? Authorizing Provider  amoxicillin-clavulanate (AUGMENTIN) 875-125 MG tablet Take 1 tablet by mouth 2 (two) times daily. 11/10/23 11/20/23 Yes [provider]  ondansetron  (ZOFRAN -ODT) 4 MG disintegrating tablet Take 1 tablet (4 mg total) by mouth every 6 (six) hours as needed for nausea or vomiting. 11/13/23  Yes Akeila Lana H, PA-C    Allergies: Tolmetin    Review of Systems  Neurological:  Positive for dizziness.  All other systems reviewed and are negative.   Updated Vital Signs BP 129/75   Pulse 67   Temp 98.2 F (36.8 C) (Oral)   Resp 15   SpO2 99%   Physical Exam Vitals and nursing note reviewed.  Constitutional:      General: He is not in acute distress.    Appearance: Normal appearance.  HENT:     Head: Normocephalic and atraumatic.     Comments: Some increased redness without significant soft tissue swelling of the right cheek, some tenderness to palpation in this location.    Mouth/Throat:     Comments: Very significant dental disease, broken teeth  throughout the mouth, no obvious abscess associated with gums on either side Eyes:     General:        Right eye: No discharge.        Left eye: No discharge.  Cardiovascular:     Rate and Rhythm: Normal rate and regular rhythm.     Heart sounds: No murmur heard.    No friction rub. No gallop.  Pulmonary:     Effort: Pulmonary effort is normal.     Breath sounds: Normal breath sounds.  Abdominal:     General: Bowel sounds are normal.     Palpations: Abdomen is soft.  Lymphadenopathy:     Cervical: No cervical adenopathy.  Skin:    General: Skin is warm and dry.     Capillary Refill: Capillary refill takes less than 2 seconds.  Neurological:     Mental Status: He is alert and oriented to person, place, and time.  Psychiatric:        Mood and Affect: Mood normal.        Behavior: Behavior normal.     (all labs ordered are listed, but only abnormal results are displayed) Labs Reviewed  BASIC METABOLIC PANEL WITH GFR - Abnormal; Notable for the following components:      Result Value   Glucose, Bld 106 (*)    BUN 21 (*)    All other components within normal limits  CBC  TROPONIN T, HIGH SENSITIVITY  EKG: None  Radiology: CT Maxillofacial W Contrast Result Date: 11/13/2023 CLINICAL DATA:  dental infection EXAM: CT MAXILLOFACIAL WITH CONTRAST TECHNIQUE: Multidetector CT imaging of the maxillofacial structures was performed with intravenous contrast. Multiplanar CT image reconstructions were also generated. RADIATION DOSE REDUCTION: This exam was performed according to the departmental dose-optimization program which includes automated exposure control, adjustment of the mA and/or kV according to patient size and/or use of iterative reconstruction technique. CONTRAST:  80mL OMNIPAQUE  IOHEXOL  300 MG/ML  SOLN COMPARISON:  Maxillofacial CT dated April 05, 2015. FINDINGS: Osseous: The facial bones are intact. There is poor dentition. There are caries involving the left lateral  maxillary incisor and the right canine. There is also a caries involving the right mandibular first premolar. Caries are present within the mandibular molars and the residual maxillary molars. There is no evidence of osteomyelitis. 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 The patient is status post lateral plate fixation of the right mandibular condyle. Orbits: Negative. Sinuses: Mucosal disease within the ethmoid air cells. A polypoid mucosal density is also present within the right maxillary sinus. The frontal and sphenoid sinuses are clear. Soft tissues: No evidence of abscess. No significant soft tissue swelling or inflammatory change demonstrated. Limited intracranial: Negative. IMPRESSION: 1. Poor dentition with numerous caries. No definite evidence of soft tissue infection. 2. Mild paranasal sinus disease. Electronically Signed   By: Evalene Coho M.D.   On: 11/13/2023 15:13     Procedures   Medications Ordered in the ED  iohexol  (OMNIPAQUE ) 300 MG/ML solution 100 mL (80 mLs Intravenous Contrast Given 11/13/23 1327)                                    Medical Decision Making Amount and/or Complexity of Data Reviewed Labs: ordered. Radiology: ordered.  Risk Prescription drug management.   This patient is a 43 y.o. male  who presents to the ED for concern of dizziness, jaw pain.   Differential diagnoses prior to evaluation: The emergent differential diagnosis includes, but is not limited to,  BPPV, vestibular migraine, head trauma, AVM, intracranial tumor, multiple sclerosis, drug-related, CVA, orthostatic hypotension, sepsis, hypoglycemia, electrolyte disturbance, anemia, anxiety . This is not an exhaustive differential.   Past Medical History / Co-morbidities / Social History: HLD, anxiety, hypothyroidism, IBS, hx of jaw fracture, dental disease  Physical Exam: Physical exam performed. The pertinent findings include: HENT:     Head:  Normocephalic and atraumatic.     Comments: Some increased redness without significant soft tissue swelling of the right cheek, some tenderness to palpation in this location.    Mouth/Throat:     Comments: Very significant dental disease, broken teeth throughout the mouth, no obvious abscess associated with gums on either side  Lab Tests/Imaging studies: I personally interpreted labs/imaging and the pertinent results include:  CBC unremarkable, BMP with mildly elevated glucose at 106, BUN 21, troponin negative x 1 with no active chest pain. Ct maxillofacial w contrast shows no evidence of acute abnormality other than known dental disease -- no osteonecrosis, no abscess noted. I agree with the radiologist interpretation.  Cardiac monitoring: EKG obtained and interpreted by myself and attending physician which shows: Normal sinus rhythm, some nonspecific ST-T changes, with no active chest pain, I do not think that there is a primary cardiac etiology to explain patient's symptoms, suspect the his general malaise is related to his dental disease  Medications: I ordered medication including encouraged him to continue his antibiotics, discharged with some nausea medication as needed encourage PCP/dental follow-up..  I have reviewed the patients home medicines and have made adjustments as needed.   Disposition: After consideration of the diagnostic results and the patients response to treatment, I feel that patient is stable for discharge with plan as above .   emergency department workup does not suggest an emergent condition requiring admission or immediate intervention beyond what has been performed at this time. The plan is: as above. The patient is safe for discharge and has been instructed to return immediately for worsening symptoms, change in symptoms or any other concerns.   Final diagnoses:  Pain, dental  Nausea    ED Discharge Orders          Ordered    ondansetron  (ZOFRAN -ODT) 4 MG  disintegrating tablet  Every 6 hours PRN        11/13/23 1523               Clifton Safley, [place] H, PA-C 11/13/23 1527    Franklyn Sid SAILOR, MD 11/14/23 1750
